# Patient Record
Sex: Male | Born: 1935 | Race: White | Hispanic: No | Marital: Married | Smoking: Never smoker
Health system: Southern US, Community
[De-identification: ages and names within clinical notes are randomized; demographics above are authoritative.]

## PROBLEM LIST (undated history)

## (undated) DIAGNOSIS — I82409 Acute embolism and thrombosis of unspecified deep veins of unspecified lower extremity: Secondary | ICD-10-CM

## (undated) DIAGNOSIS — K219 Gastro-esophageal reflux disease without esophagitis: Secondary | ICD-10-CM

## (undated) DIAGNOSIS — N3281 Overactive bladder: Secondary | ICD-10-CM

## (undated) DIAGNOSIS — K439 Ventral hernia without obstruction or gangrene: Secondary | ICD-10-CM

## (undated) DIAGNOSIS — G4730 Sleep apnea, unspecified: Secondary | ICD-10-CM

## (undated) DIAGNOSIS — E785 Hyperlipidemia, unspecified: Secondary | ICD-10-CM

## (undated) DIAGNOSIS — I10 Essential (primary) hypertension: Secondary | ICD-10-CM

## (undated) DIAGNOSIS — I2699 Other pulmonary embolism without acute cor pulmonale: Secondary | ICD-10-CM

## (undated) DIAGNOSIS — C189 Malignant neoplasm of colon, unspecified: Secondary | ICD-10-CM

## (undated) HISTORY — PX: COLON RESECTION: SHX5231

## (undated) HISTORY — DX: Gastro-esophageal reflux disease without esophagitis: K21.9

## (undated) HISTORY — PX: OTHER SURGICAL HISTORY: SHX169

## (undated) HISTORY — DX: Malignant neoplasm of colon, unspecified: C18.9

## (undated) HISTORY — DX: Ventral hernia without obstruction or gangrene: K43.9

## (undated) HISTORY — PX: TONSILLECTOMY: SUR1361

## (undated) HISTORY — DX: Overactive bladder: N32.81

## (undated) HISTORY — DX: Sleep apnea, unspecified: G47.30

## (undated) HISTORY — DX: Acute embolism and thrombosis of unspecified deep veins of unspecified lower extremity: I82.409

## (undated) HISTORY — DX: Other pulmonary embolism without acute cor pulmonale: I26.99

## (undated) HISTORY — DX: Essential (primary) hypertension: I10

## (undated) HISTORY — DX: Hyperlipidemia, unspecified: E78.5

---

## 1998-06-17 DIAGNOSIS — C189 Malignant neoplasm of colon, unspecified: Secondary | ICD-10-CM

## 1998-06-17 HISTORY — DX: Malignant neoplasm of colon, unspecified: C18.9

## 2002-07-28 ENCOUNTER — Encounter: Payer: Self-pay | Admitting: Internal Medicine

## 2004-06-29 ENCOUNTER — Ambulatory Visit: Payer: Self-pay | Admitting: Hematology & Oncology

## 2004-12-27 ENCOUNTER — Ambulatory Visit: Payer: Self-pay | Admitting: Internal Medicine

## 2004-12-27 ENCOUNTER — Ambulatory Visit: Payer: Self-pay | Admitting: Hematology & Oncology

## 2005-01-11 ENCOUNTER — Ambulatory Visit: Payer: Self-pay | Admitting: Internal Medicine

## 2005-11-25 ENCOUNTER — Encounter (INDEPENDENT_AMBULATORY_CARE_PROVIDER_SITE_OTHER): Payer: Self-pay | Admitting: *Deleted

## 2005-11-25 ENCOUNTER — Inpatient Hospital Stay (HOSPITAL_COMMUNITY): Admission: EM | Admit: 2005-11-25 | Discharge: 2005-11-29 | Payer: Self-pay | Admitting: Emergency Medicine

## 2005-12-20 ENCOUNTER — Ambulatory Visit: Payer: Self-pay | Admitting: Hematology & Oncology

## 2005-12-20 ENCOUNTER — Encounter: Admission: RE | Admit: 2005-12-20 | Discharge: 2005-12-20 | Payer: Self-pay | Admitting: General Surgery

## 2005-12-21 ENCOUNTER — Encounter (INDEPENDENT_AMBULATORY_CARE_PROVIDER_SITE_OTHER): Payer: Self-pay | Admitting: *Deleted

## 2005-12-27 LAB — CBC WITH DIFFERENTIAL/PLATELET
BASO%: 0.2 % (ref 0.0–2.0)
Basophils Absolute: 0 10*3/uL (ref 0.0–0.1)
Eosinophils Absolute: 0.2 10*3/uL (ref 0.0–0.5)
HCT: 44.2 % (ref 38.7–49.9)
HGB: 15.6 g/dL (ref 13.0–17.1)
LYMPH%: 22 % (ref 14.0–48.0)
MCHC: 35.3 g/dL (ref 32.0–35.9)
MONO#: 0.6 10*3/uL (ref 0.1–0.9)
NEUT%: 64.6 % (ref 40.0–75.0)
Platelets: 178 10*3/uL (ref 145–400)
WBC: 6.5 10*3/uL (ref 4.0–10.0)
lymph#: 1.4 10*3/uL (ref 0.9–3.3)

## 2005-12-27 LAB — COMPREHENSIVE METABOLIC PANEL
Albumin: 4.2 g/dL (ref 3.5–5.2)
BUN: 13 mg/dL (ref 6–23)
CO2: 24 mEq/L (ref 19–32)
Calcium: 8.9 mg/dL (ref 8.4–10.5)
Chloride: 109 mEq/L (ref 96–112)
Creatinine, Ser: 0.88 mg/dL (ref 0.40–1.50)
Glucose, Bld: 99 mg/dL (ref 70–99)
Potassium: 4.1 mEq/L (ref 3.5–5.3)

## 2006-01-21 ENCOUNTER — Encounter (INDEPENDENT_AMBULATORY_CARE_PROVIDER_SITE_OTHER): Payer: Self-pay | Admitting: *Deleted

## 2006-01-21 ENCOUNTER — Inpatient Hospital Stay (HOSPITAL_COMMUNITY): Admission: RE | Admit: 2006-01-21 | Discharge: 2006-02-01 | Payer: Self-pay | Admitting: General Surgery

## 2006-01-27 ENCOUNTER — Encounter: Payer: Self-pay | Admitting: Vascular Surgery

## 2006-01-27 ENCOUNTER — Ambulatory Visit: Payer: Self-pay | Admitting: Cardiology

## 2006-01-27 ENCOUNTER — Encounter: Payer: Self-pay | Admitting: Cardiology

## 2006-02-07 ENCOUNTER — Inpatient Hospital Stay (HOSPITAL_COMMUNITY): Admission: EM | Admit: 2006-02-07 | Discharge: 2006-03-03 | Payer: Self-pay | Admitting: Emergency Medicine

## 2006-02-13 ENCOUNTER — Encounter (INDEPENDENT_AMBULATORY_CARE_PROVIDER_SITE_OTHER): Payer: Self-pay | Admitting: *Deleted

## 2006-04-07 ENCOUNTER — Encounter (INDEPENDENT_AMBULATORY_CARE_PROVIDER_SITE_OTHER): Payer: Self-pay | Admitting: *Deleted

## 2006-04-30 ENCOUNTER — Encounter (INDEPENDENT_AMBULATORY_CARE_PROVIDER_SITE_OTHER): Payer: Self-pay | Admitting: *Deleted

## 2006-05-19 ENCOUNTER — Encounter: Admission: RE | Admit: 2006-05-19 | Discharge: 2006-05-19 | Payer: Self-pay | Admitting: Internal Medicine

## 2006-12-22 ENCOUNTER — Ambulatory Visit: Payer: Self-pay | Admitting: Hematology & Oncology

## 2006-12-24 LAB — CBC WITH DIFFERENTIAL/PLATELET
Basophils Absolute: 0 10*3/uL (ref 0.0–0.1)
EOS%: 2.1 % (ref 0.0–7.0)
HCT: 41.2 % (ref 38.7–49.9)
HGB: 14.7 g/dL (ref 13.0–17.1)
MCH: 31.8 pg (ref 28.0–33.4)
MONO#: 0.5 10*3/uL (ref 0.1–0.9)
NEUT#: 3.9 10*3/uL (ref 1.5–6.5)
NEUT%: 61.6 % (ref 40.0–75.0)
RDW: 12.8 % (ref 11.2–14.6)
WBC: 6.3 10*3/uL (ref 4.0–10.0)
lymph#: 1.7 10*3/uL (ref 0.9–3.3)

## 2006-12-24 LAB — COMPREHENSIVE METABOLIC PANEL
ALT: 11 U/L (ref 0–53)
AST: 13 U/L (ref 0–37)
Albumin: 4.3 g/dL (ref 3.5–5.2)
BUN: 14 mg/dL (ref 6–23)
CO2: 23 mEq/L (ref 19–32)
Calcium: 8.7 mg/dL (ref 8.4–10.5)
Chloride: 109 mEq/L (ref 96–112)
Creatinine, Ser: 0.85 mg/dL (ref 0.40–1.50)
Potassium: 4.1 mEq/L (ref 3.5–5.3)

## 2006-12-24 LAB — CEA: CEA: <0.5 ng/mL (ref 0.0–5.0)

## 2007-12-23 ENCOUNTER — Ambulatory Visit: Payer: Self-pay | Admitting: Hematology & Oncology

## 2007-12-23 ENCOUNTER — Encounter: Payer: Self-pay | Admitting: Internal Medicine

## 2007-12-23 LAB — COMPREHENSIVE METABOLIC PANEL
Albumin: 4.4 g/dL (ref 3.5–5.2)
Alkaline Phosphatase: 76 U/L (ref 39–117)
BUN: 16 mg/dL (ref 6–23)
CO2: 22 mEq/L (ref 19–32)
Calcium: 9.2 mg/dL (ref 8.4–10.5)
Glucose, Bld: 86 mg/dL (ref 70–99)
Potassium: 4.2 mEq/L (ref 3.5–5.3)
Sodium: 141 mEq/L (ref 135–145)
Total Protein: 7 g/dL (ref 6.0–8.3)

## 2008-08-07 ENCOUNTER — Ambulatory Visit: Payer: Self-pay | Admitting: Family Medicine

## 2008-08-07 DIAGNOSIS — R3129 Other microscopic hematuria: Secondary | ICD-10-CM | POA: Insufficient documentation

## 2008-08-07 DIAGNOSIS — R109 Unspecified abdominal pain: Secondary | ICD-10-CM | POA: Insufficient documentation

## 2008-08-07 LAB — CONVERTED CEMR LAB
Casts: 0 /lpf
WBC Urine, dipstick: NEGATIVE
pH: 5.5

## 2008-08-08 ENCOUNTER — Inpatient Hospital Stay (HOSPITAL_COMMUNITY): Admission: EM | Admit: 2008-08-08 | Discharge: 2008-08-15 | Payer: Self-pay | Admitting: Emergency Medicine

## 2008-08-08 ENCOUNTER — Encounter: Payer: Self-pay | Admitting: Family Medicine

## 2008-08-10 ENCOUNTER — Ambulatory Visit: Payer: Self-pay | Admitting: *Deleted

## 2008-08-10 ENCOUNTER — Encounter (INDEPENDENT_AMBULATORY_CARE_PROVIDER_SITE_OTHER): Payer: Self-pay | Admitting: Internal Medicine

## 2008-08-15 ENCOUNTER — Encounter (INDEPENDENT_AMBULATORY_CARE_PROVIDER_SITE_OTHER): Payer: Self-pay | Admitting: Internal Medicine

## 2008-12-20 ENCOUNTER — Ambulatory Visit: Payer: Self-pay | Admitting: Hematology & Oncology

## 2008-12-21 ENCOUNTER — Encounter: Payer: Self-pay | Admitting: Internal Medicine

## 2008-12-21 LAB — COMPREHENSIVE METABOLIC PANEL
Alkaline Phosphatase: 65 U/L (ref 39–117)
Creatinine, Ser: 1.16 mg/dL (ref 0.40–1.50)
Glucose, Bld: 89 mg/dL (ref 70–99)
Sodium: 141 mEq/L (ref 135–145)
Total Bilirubin: 0.6 mg/dL (ref 0.3–1.2)
Total Protein: 7.2 g/dL (ref 6.0–8.3)

## 2008-12-21 LAB — CEA: CEA: <0.5 ng/mL (ref 0.0–5.0)

## 2008-12-21 LAB — CBC WITH DIFFERENTIAL (CANCER CENTER ONLY)
BASO%: 0.3 % (ref 0.0–2.0)
LYMPH#: 1.3 10*3/uL (ref 0.9–3.3)
MONO#: 0.4 10*3/uL (ref 0.1–0.9)
Platelets: 157 10*3/uL (ref 145–400)
RDW: 12.3 % (ref 10.5–14.6)
WBC: 5.5 10*3/uL (ref 4.0–10.0)

## 2009-09-06 ENCOUNTER — Encounter (INDEPENDENT_AMBULATORY_CARE_PROVIDER_SITE_OTHER): Payer: Self-pay | Admitting: *Deleted

## 2009-09-11 ENCOUNTER — Ambulatory Visit: Payer: Self-pay | Admitting: Internal Medicine

## 2009-09-11 DIAGNOSIS — D689 Coagulation defect, unspecified: Secondary | ICD-10-CM | POA: Insufficient documentation

## 2009-09-11 DIAGNOSIS — K5900 Constipation, unspecified: Secondary | ICD-10-CM | POA: Insufficient documentation

## 2009-09-11 DIAGNOSIS — R1084 Generalized abdominal pain: Secondary | ICD-10-CM | POA: Insufficient documentation

## 2009-09-11 DIAGNOSIS — K625 Hemorrhage of anus and rectum: Secondary | ICD-10-CM | POA: Insufficient documentation

## 2009-09-11 DIAGNOSIS — Z85038 Personal history of other malignant neoplasm of large intestine: Secondary | ICD-10-CM | POA: Insufficient documentation

## 2009-10-17 ENCOUNTER — Encounter: Payer: Self-pay | Admitting: Internal Medicine

## 2009-10-19 ENCOUNTER — Ambulatory Visit: Payer: Self-pay | Admitting: Internal Medicine

## 2009-10-19 LAB — CONVERTED CEMR LAB
Basophils Absolute: 0 10*3/uL (ref 0.0–0.1)
Eosinophils Absolute: 0.1 10*3/uL (ref 0.0–0.7)
Lymphocytes Relative: 14.2 % (ref 12.0–46.0)
MCHC: 34.7 g/dL (ref 30.0–36.0)
Neutrophils Relative %: 78 % — ABNORMAL HIGH (ref 43.0–77.0)
Platelets: 140 10*3/uL — ABNORMAL LOW (ref 150.0–400.0)
RDW: 13.2 % (ref 11.5–14.6)

## 2009-10-21 ENCOUNTER — Encounter: Payer: Self-pay | Admitting: Internal Medicine

## 2009-10-30 ENCOUNTER — Ambulatory Visit: Payer: Self-pay | Admitting: Internal Medicine

## 2009-10-30 DIAGNOSIS — K5289 Other specified noninfective gastroenteritis and colitis: Secondary | ICD-10-CM | POA: Insufficient documentation

## 2009-12-19 ENCOUNTER — Ambulatory Visit: Payer: Self-pay | Admitting: Hematology & Oncology

## 2009-12-20 ENCOUNTER — Encounter: Payer: Self-pay | Admitting: Internal Medicine

## 2009-12-20 LAB — COMPREHENSIVE METABOLIC PANEL
ALT: 10 U/L (ref 0–53)
Albumin: 4.3 g/dL (ref 3.5–5.2)
Alkaline Phosphatase: 71 U/L (ref 39–117)
CO2: 22 mEq/L (ref 19–32)
Glucose, Bld: 86 mg/dL (ref 70–99)
Potassium: 4.4 mEq/L (ref 3.5–5.3)
Sodium: 137 mEq/L (ref 135–145)
Total Protein: 6.8 g/dL (ref 6.0–8.3)

## 2009-12-20 LAB — CBC WITH DIFFERENTIAL (CANCER CENTER ONLY)
BASO%: 0.6 % (ref 0.0–2.0)
Eosinophils Absolute: 0.1 10*3/uL (ref 0.0–0.5)
MONO#: 0.4 10*3/uL (ref 0.1–0.9)
MONO%: 9 % (ref 0.0–13.0)
NEUT#: 2.8 10*3/uL (ref 1.5–6.5)
Platelets: 149 10*3/uL (ref 145–400)
RBC: 4.48 10*6/uL (ref 4.20–5.70)
WBC: 4.6 10*3/uL (ref 4.0–10.0)

## 2009-12-20 LAB — VITAMIN D 25 HYDROXY (VIT D DEFICIENCY, FRACTURES): Vit D, 25-Hydroxy: 38 ng/mL (ref 30–89)

## 2010-07-17 NOTE — Assessment & Plan Note (Signed)
Summary: Followup from colonoscopy-   History of Present Illness Visit Type: Follow-up Visit Primary GI MD: Yancey Flemings MD Primary Provider: Creola Corn, MD  Requesting Provider: n/a Chief Complaint: F/u colonoscopy, pt still having lower abd discomfort with intermittant rectal bleeding with hard BM's. Pt did start the glycolax with some improvement in constipation. History of Present Illness:   77 year old male with hypertension, hyperlipidemia, deep vein thrombosis with pulmonary embolus, recurrent deep vein thrombosis off Coumadin but now on chronic Coumadin therapy, and remote history of colon cancer x2 for which he underwent sigmoid colectomy and right hemicolectomy respectively. He was seen in the office March 28 with complaints of bloating, rectal bleeding, and abdominal discomfort. He seemed to be having some difficulties with constipation. He was due for routine followup colonoscopy which was performed on Oct 19, 2009. The small rectal pouch revealed nonspecific inflammation. The residual colon was normal without polyps or cancers. The ileocolonic anastomosis as well as the terminal ileum revealed multiple punctate ulcers. Biopsies were obtained and revealed active ileitis. No evidence of malignancy. The usual differential diagnosis suggested. No granulomas seen. Blood work revealed hemoglobin of 13.4, blood cell count 6.3, and total platelet count of 140,000. C-Reactive protein was minimally elevated at 6.59. He presents today for followup. He is accompanied by his wife. He has not been using MiraLax as recommended. Continues to describe some difficulty with evacuation and some discomfort related to position. Some vague bloating. We discussed in detail the findings at colonoscopy as well as the pathology. He tells me that he had a CT scan of the abdomen last year. I was able to obtain and review her report from February 22. He was found to have gallstones, small left renal calculus, and postoperative  changes. Prior KUB February 13, 2009 revealed constipation.   GI Review of Systems    Reports abdominal pain and  bloating.     Location of  Abdominal pain: lower abdomen.    Denies acid reflux, belching, chest pain, dysphagia with liquids, dysphagia with solids, heartburn, loss of appetite, nausea, vomiting, vomiting blood, weight loss, and  weight gain.      Reports constipation and  rectal bleeding.     Denies anal fissure, black tarry stools, change in bowel habit, diarrhea, diverticulosis, fecal incontinence, heme positive stool, hemorrhoids, irritable bowel syndrome, jaundice, light color stool, liver problems, and  rectal pain.    Current Medications (verified): 1)  Lipitor 10 Mg Tabs (Atorvastatin Calcium) .Marland Kitchen.. 1 Tablet By Mouth Once Daily 2)  Multivitamins  Tabs (Multiple Vitamin) .Marland Kitchen.. 1 Tablet By Mouth Once Daily 3)  Aspirin 81 Mg Tbec (Aspirin) .Marland Kitchen.. 1 Tablet By Mouth Once Daily 4)  Coumadin 5 Mg Tabs (Warfarin Sodium) .... One Tablet By Mouth Once Daily 5)  Fish Oil 1000 Mg Caps (Omega-3 Fatty Acids) .... One Tablet By Mouth Once Daily 6)  Stool Softener 100 Mg Caps (Docusate Sodium) .... One Tablet By Mouth Once Daily 7)  Cpap .... As Directed 8)  Lisinopril 10 Mg Tabs (Lisinopril) .... One Tablet By Mouth Once Daily 9)  Glycolax  Powd (Polyethylene Glycol 3350) .Marland KitchenMarland KitchenMarland Kitchen 17 Grams in 8 Oz of Water As Needed  Allergies (verified): 1)  ! Demerol 2)  ! Codeine 3)  ! Phenergan  Past History:  Past Medical History: Reviewed history from 09/11/2009 and no changes required. colon cancer blood clot in lung colostomy DVT Sleep Apnea Small Bowel Obstruction Hypertension Hyperlipidemia  Past Surgical History: Reviewed history from 09/11/2009 and no changes  required. colon cancer 1999 lesions from prior surgery removed  twice 2008 Rt. sigmoid resection with colostomy rt. hemicolectomy Appendectomy  Family History: Reviewed history from 09/11/2009 and no changes  required. mother deceased - diabetic father deceased age 28 heart attack No FH of Colon Cancer:  Social History: Reviewed history from 09/11/2009 and no changes required. denies smoking, drinking or recreational drug use Retired Married 2 childern  Review of Systems  The patient denies allergy/sinus, anemia, anxiety-new, arthritis/joint pain, back pain, blood in urine, breast changes/lumps, change in vision, confusion, cough, coughing up blood, depression-new, fainting, fatigue, fever, headaches-new, hearing problems, heart murmur, heart rhythm changes, itching, menstrual pain, muscle pains/cramps, night sweats, nosebleeds, pregnancy symptoms, shortness of breath, skin rash, sleeping problems, sore throat, swelling of feet/legs, swollen lymph glands, thirst - excessive , urination - excessive , urination changes/pain, urine leakage, vision changes, and voice change.    Vital Signs:  Patient profile:   77 year old male Height:      62 inches Weight:      197.38 pounds BMI:     36.23 Pulse rate:   68 / minute Pulse rhythm:   regular BP sitting:   134 / 70  (left arm) Cuff size:   regular  Vitals Entered By: Christie Nottingham CMA Duncan Dull) (Oct 30, 2009 1:50 PM)  Physical Exam  General:  Well developed, well nourished, no acute distress. Head:  Normocephalic and atraumatic. Eyes:  PERRLA, no icterus. Mouth:  No deformity or lesions, dentition normal. Lungs:  Clear throughout to auscultation. Heart:  Regular rate and rhythm; no murmurs, rubs,  or bruits. Abdomen:  obese but soft without tenderness. Ostomy in the left lower quadrant. Multiple surgical incisions well healed. No obvious hernia. Prominent vascular pattern on the abdominal wall below the umbilicus. Pulses:  Normal pulses noted. Extremities:  no edema  Neurologic:  alert and oriented Skin:  no jaundice Psych:  Alert and cooperative. Normal mood and affect.   Impression & Recommendations:  Problem # 1:  ILEITIS  (ICD-558.9) nonspecific mild ileitis. Possibly mild Crohn's disease. I discussed the implications of this diagnosis with the patient and his wife. It's uncertain to me that his symptom complex can be explained by the findings at colonoscopy. No overwhelming changes to the small bowel on CT last year. We discussed a number of options including additional workup with CT enterography. Capsule inappropriate given multiple surgeries. We also discussed possible empiric therapies such as Entocort (though this may be prohibitively expensive) or low-dose prednisone. At this point he is less inclined to pursue additional investigations or these empiric therapies without more obvious symptoms. As such, who will be observed with time.Marland Kitchen He will try to improve constipation GlycoLax. He will call the office if new or worsening symptoms develop.  Problem # 2:  ABDOMINAL PAIN -GENERALIZED (ICD-789.07) generalized abdominal pain most likely due to adhesions and fixed portion of the colon . The problem seems to be exacerbated by relative constipation.  Plan: #1. GlycoLax. Titrated to need  Problem # 3:  PERSONAL HISTORY MALIG NEOPLASM LARGE INTESTINE (ICD-V10.05) Assessment: Comment Only  Problem # 4:  CONSTIPATION (ICD-564.00) Assessment: Unchanged  Problem # 5:  RECTAL BLEEDING (ICD-569.3) due to mild diversion pouchitis. No specific therapy available  Patient Instructions: 1)  Nothing to do for patient today. 2)  The medication list was reviewed and reconciled.  All changed / newly prescribed medications were explained.  A complete medication list was provided to the patient / caregiver. 3)  copy:  Dr. Creola Corn

## 2010-07-17 NOTE — Op Note (Signed)
Summary: operative report   NAME:  [name], [name]             ACCOUNT NO.:  [id number]   MEDICAL RECORD NO.:  [id number]          PATIENT TYPE:  INP   LOCATION:  2899                         FACILITY:  [facility]   PHYSICIAN:  Sharlet Salina T. Hoxworth, M.D.DATE OF BIRTH:  03/27/1934   DATE OF PROCEDURE:  01/21/2006  DATE OF DISCHARGE:                                 OPERATIVE REPORT   PREOPERATIVE DIAGNOSES:  1. Hartmann colostomy.  2. Ventral hernia.  3. History of small bowel obstruction.   POSTOPERATIVE DIAGNOSIS:  1. Hartmann colostomy.  2. Ventral hernia.  3. History of small bowel obstruction.  4. Parastomal hernia.   SURGICAL PROCEDURES:  1. Attempted takedown of Hartmann colostomy.  2. Repair of ventral hernia with biologic mesh.  3. Ring repair of parastomal hernia with biologic mesh.  4. Lysis of adhesions.   SURGEON:  [name], [name]   ASSISTANT:  Ovidio Kin, [name]   ANESTHESIA:  General.   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  He has had a colostomy  since that time.  I initially saw him about 2 years ago for consideration  for takedown of his colostomy at which point he declined.  He however has  had a couple of episodes of small bowel obstruction resolving spontaneously  and has a good-sized upper abdominal ventral incisional hernia that has  developed.  With these findings he represented and due to the new problems  as well which could be corrected the same time, we have elected to proceed  with takedown of his Gertie Gowda colostomy, repair of his ventral hernia and  lysis of adhesions.  He has had a preoperative barium enema showing a fairly  short, but what appears to be adequate rectosigmoid pouch about 7 cm in  length.  Preoperatively the nature of the procedure, its indications, risks  of bleeding, infection were discussed and understood.  We discussed  potential difficulties reversing his colostomy.  He is now brought to  operating room for this procedure.   DESCRIPTION OF OPERATION:  Following mechanical and antibiotic bowel prep at  home the patient brought to operating room and placed supine position  operating table and general orotracheal anesthesia was induced.  He received  broad spectrum preoperative IV antibiotics.  A pursestring silk suture was  used to occlude his stoma and the abdomen and perineum were widely sterilely  prepped and draped.  He had a long midline incision.  This entire incision  was opened as his large ventral hernia was in the upper abdomen.  Dissection  was carried down through subcutaneous tissue, the peritoneum entered.  The  abdomen was relatively free of adhesions, considering his history.  Some  omental adhesions into the hernia sac and some adhesions of omentum and  small bowel to the anterior abdominal wall were lysed.  There were a number  of inner loop adhesions of the small bowel and also some adhesions  of small  bowel down to the pelvis which were completely lysed under direct vision.  The pelvis was then the widely exposed.  There were two Prolene sutures at  the peritoneal reflection which appeared to likely mark the end of the  rectal pouch.  This was right at the peritoneal reflection with just a  smooth peritoneum up to the bladder.  Initially Dr. Ezzard Standing scoped the  patient from below with a rigid sigmoidoscope.  This could not be advanced  past what appeared to be a stricture at about 7 or 8 cm and I could not feel  the tip of the scope at the pouch.  We therefore dissected out the rectal  stump developing planes between the sump and the sacrum and between the  stump and the bladder and working laterally down along the pelvic sidewall  and it was mobilized for about 5 cm.  At this point we again tried the scope  and also a  curve EEA stapler to see if we could pass this up to the end of  the stump, but it would not advance to within several centimeters of the  end.  At this point then we opened the rectal stump from within the  abdominal cavity and it was found to be markedly thickened, stenotic and  actually quite friable.  The stenosis extended for at least several  centimeters down to probably about the level of the mid prostate.  I tried  to gently dilate this with a Tresa Endo but the tissue was fibrotic and friable  and only split rather than dilating.  I could get my finger through this  just barely and with Dr. Ezzard Standing scoping from below again, the rectum did not  appear to open up into a more normal rectum for another approximately 3 cm  beyond where we had dissected which would be down probably around 7 cm down  to the probably lower border of the prostate or below.  Examine the rectum  again from above, we felt that this dissection would carry [place] so low in this  very narrow male pelvis and with uncertain condition of the radiated rectum  below and obviously radiation damage to rectum to this point that we could  not safely accomplish an anastomosis.  I did not feel there was any way to  get a stapling device down this low in this very narrow pelvis with no real  handle of proximal colon to work with and this appeared well below where it  could be safely hand sutured.  With these concerns and with the concern  about the condition of the rectum post radiation, we felt that it was  overall safest and best to abandon attempts at the colostomy takedown and to  close the rectal stump.  Therefore the stump was closed with running 2-0  Vicryl.  The pelvis was irrigated.  Hemostasis assured.  Noted at this time  was a moderate size parastomal hernia which I really did not appreciate  preoperatively.  I felt it would be best to repair this with the patient  history of episodic small-bowel obstruction.  This was  accomplished by completely dissecting adhesions away from the edge of the abdominal wall  defect defining it which was enlarged moderately.  The defect was carried  down with one 0 Prolene suture snugging the defect down to a fingertip next  to the bowel.  An 8 x 12 cm ultra thick, MDF acellular dermis of patch was  then used, split, wrapped around the stoma and sutured to the peritoneum and  fascia circumferentially with interrupted 0 Prolenes as a strengthening  patch around the stoma.  Following this, attention was turned to the fairly  large upper abdominal ventral incisional hernia.  The hernia sac was  completely excised.  Skin and subcutaneous flaps raised back several  centimeters in all directions from the upper end of the incision down to  about the level of the umbilicus where there was no further herniation.  Following this the midline fascia was closed with running #1 PDS beginning  at the either end of the incision and tied centrally with interrupted 0  Prolene sutures intermittently.  This was done with mild tension on the  upper fascia where the hernia had been.  Following this a 8 x 12 cm piece of  MDF acellular dermis was used as an onlay patch and sutured  circumferentially with interrupted 0-0 Prolene with nice wide coverage of  the involved fascia.  The subcutaneous tissue was irrigated.  Closed suction  drain was left subcutaneous space and skin closed with staples.  Sponge,  needle and instrument counts were correct.  Dry sterile dressings was  applied.  The pursestring suture was removed from the stoma site.  Ostomy  bag was placed.  The patient taken recovery in satisfactory condition.     [name], [name]  Electronically Signed    BTH/MEDQ  D:  01/21/2006  T:  01/21/2006  Job:  [id number]

## 2010-07-17 NOTE — Letter (Signed)
Summary: [facility] Instructions  [facility]  [address] [place], [place] [address]   Phone: [phone]  Fax: [phone]       [name]    [birth year]    MRN: [id number]        Procedure Day /Date:THURSDAY, 10/19/09     Arrival Time:7:30 AM     Procedure Time:8:00 AM     Location of Procedure:                    X  [facility] (4th Floor)   PREPARATION FOR COLONOSCOPY WITH MOVIPREP   Starting 5 days prior to your procedure 5/1/11do not eat nuts, seeds, popcorn, corn, beans, peas,  salads, or any raw vegetables.  Do not take any fiber supplements (e.g. Metamucil, Citrucel, and Benefiber).  THE DAY BEFORE YOUR PROCEDURE         DATE: 10/18/09  DAY: WEDNESDAY  1.  Drink clear liquids the entire day-NO SOLID FOOD  2.  Do not drink anything colored red or purple.  Avoid juices with pulp.  No orange juice.  3.  Drink at least 64 oz. (8 glasses) of fluid/clear liquids during the day to prevent dehydration and help the prep work efficiently.  CLEAR LIQUIDS INCLUDE: Water Jello Ice Popsicles Tea (sugar ok, no milk/cream) Powdered fruit flavored drinks Coffee (sugar ok, no milk/cream) Gatorade Juice: apple, white grape, white cranberry  Lemonade Clear bullion, consomm, broth Carbonated beverages (any kind) Strained chicken noodle soup Hard Candy                             4.  In the morning, mix first dose of MoviPrep solution:    Empty 1 Pouch A and 1 Pouch B into the disposable container    Add lukewarm drinking water to the top line of the container. Mix to dissolve    Refrigerate (mixed solution should be used within 24 hrs)  5.  Begin drinking the prep at 5:00 p.m. The MoviPrep container is divided by 4 marks.   Every 15 minutes drink the solution down to the next mark (approximately 8 oz) until the full liter is complete.   6.  Follow completed prep with 16 oz of clear liquid of your choice (Nothing red or purple).  Continue  to drink clear liquids until bedtime.  7.  Before going to bed, mix second dose of MoviPrep solution:    Empty 1 Pouch A and 1 Pouch B into the disposable container    Add lukewarm drinking water to the top line of the container. Mix to dissolve    Refrigerate  THE DAY OF YOUR PROCEDURE      DATE: 10/19/09 DAY: THURSDAY  Beginning at 3:00 a.m. (5 hours before procedure):         1. Every 15 minutes, drink the solution down to the next mark (approx 8 oz) until the full liter is complete.  2. Follow completed prep with 16 oz. of clear liquid of your choice.    3. You may drink clear liquids until 6:00 AM (2 HOURS BEFORE PROCEDURE).   MEDICATION INSTRUCTIONS  Unless otherwise instructed, you should take regular prescription medications with a small sip of water   as early as possible the morning of your procedure.   Additional medication instructions: _ STAY ON COUMADIN PER DR. Rosaura Bolon.  HAVE PT/INR CHECKED 2 DAYS PRIOR TO PROCEDURE.  OTHER INSTRUCTIONS  You will need a responsible adult at least 75 years of age to accompany you and drive you home.   This person must remain in the waiting room during your procedure.  Wear loose fitting clothing that is easily removed.  Leave jewelry and other valuables at home.  However, you may wish to bring a book to read or  an iPod/MP3 player to listen to music as you wait for your procedure to start.  Remove all body piercing jewelry and leave at home.  Total time from sign-in until discharge is approximately 2-3 hours.  You should go home directly after your procedure and rest.  You can resume normal activities the  day after your procedure.  The day of your procedure you should not:   Drive   Make legal decisions   Operate machinery   Drink alcohol   Return to work  You will receive specific instructions about eating, activities and medications before you leave.    The above instructions have been reviewed and  explained to me by   _______________________    I fully understand and can verbalize these instructions _____________________________ Date _________

## 2010-07-17 NOTE — Op Note (Signed)
Summary: operative report   NAME:  [name], [name]             ACCOUNT NO.:  [id number]   MEDICAL RECORD NO.:  [id number]          PATIENT TYPE:  INP   LOCATION:  2102                         FACILITY:  [facility]   PHYSICIAN:  Sharlet Salina T. Hoxworth, M.D.DATE OF BIRTH:  Aug 10, 1933   DATE OF PROCEDURE:  02/13/2006  DATE OF DISCHARGE:                                 OPERATIVE REPORT   PREOPERATIVE DIAGNOSIS:  Small bowel obstruction and pelvic abscess.   POSTOPERATIVE DIAGNOSIS:  Small bowel obstruction and pelvic abscess.   SURGICAL PROCEDURE:  1. Laparotomy with lysis of adhesions.  2. Drainage of pelvic abscess.  3. Small-bowel resection with anastomosis and placement of central line.   SURGEON:  Dr. Johna Sheriff   ASSISTANT:  Dr. Lorelee New   ANESTHESIA:  General.   BRIEF HISTORY:  Desten Manor is a 77 year old male, who is approximately  2-1/2 weeks following laparotomy and attempted takedown of a Hartmann  colostomy.  However, at that time, he was found to have significant  radiation damage to his rectosigmoid, and anastomosis was not possible.  He  did undergo a repair of a ventral incisional hernia using acellular dermis  and also repair of a parastomal hernia using acellular dermis.  Postoperatively, the patient's course was complicated by pulmonary embolus  requiring anticoagulation.  He then, just after discharge, was readmitted  with a small bowel obstruction that has been persistent despite conservative  management.  CT scan has shown a transition point in the pelvis with a small  amount of fluid and air adjacent to the rectal stump consistent with a small  abscess.  Due to failure to progress with conservative management with bowel  rest and NG suction over several days and persistent small bowel  obstruction, I have recommended proceeding with laparotomy.  The nature of  the procedure, indications, risks of bleeding and infection were discussed  and understood.  The  patient has undergone preoperative placement of a vena  cava filter due to recent PE and needing to be off anticoagulation for  surgery.  I have also recommended proceeding with central line placement for  postoperative TNA.   DESCRIPTION OF OPERATION:  The patient brought to the operating room, placed  in supine position on the operating table, and general orotracheal  anesthesia was induced.  The abdomen was widely sterilely prepped and draped  with Ioban and the stoma excluded.  Foley catheter was placed.  NG tube was  already placed.  He received broad spectrum preoperative IV antibiotics.  Correct patient and procedure were verified.  The previous midline incision  was used along about its lower two-thirds and dissection carried down  through subcutaneous tissue down to the fascia which was divided with  cautery.  On the upper portion of the incision, I did go ahead and divide  down through the acellular dermis, probably the lower half of it that had  been placed on his upper abdominal incisional hernia.  It seemed well  incorporated at this point to the fascia and was left in place.  Dissection  was carried down to the peritoneum and  with careful dissection, the  peritoneum was entered in 1 area, and then very dense adhesions of small  bowel were taken down from the underside of the incision with careful sharp  dissection.  There were fairly marked adhesions that would be expected at  this time postoperatively.  However, as the dissection progressed away from  the wound, these became somewhat more filmy, and we were able to clear the  entire anterior abdominal wall in all directions.  There were some dense  adhesions of the small bowel up around the area of the colostomy but no  recurrent hernia, and these were all taken down.  The ligament of Treitz was  identified, and the proximal bowel was quite dilated.  The bowel was then  traced distally, dividing multiple interloop adhesions.   The bowel remained  dilated down to the ileum which was found to be densely adherent down to the  area of the sacrum and rectal stump.  There was a very hard area of  inflammation and a very densely adherent loop of bowel to this area.  This  was carefully sharply dissected up and a small abscess cavity entered with  pus.  This was not foul smelling.  It was cultured.  This cavity was just  adjacent to and posterior to the rectal stump that had been opened at the  time of attempted takedown of his [facility] colostomy.  The stump closure  appeared intact, and I did not see any communication into the rectal stump.  This area was thoroughly irrigated.  The small bowel was then able to be  brought completely up out of the pelvis.  At this point, the area of bowel  that had been stuck down to the abscess and was essentially a portion of the  wall of the abscess was carefully inspected, and there was a small pinhole  in this bowel but also more significantly marked scarring and narrowing of  the bowel secondary to this inflammatory process over about a 5-6 inch area,  and we did not feel that this could simply be repaired and be reliably  patent.  We elected to proceed with resection as the bowel just proximal and  distal to this appeared relatively healthy.  The mesentery of the short  involved segment was then sequentially divided between clamps and tied with  2-0 silk ties.  Following this, a functional end-to-end anastomosis was  created with the GIA stapler, and the common enterotomy was closed and the  specimen removed with a single firing of the TA-60 stapler.  The anastomosis  was tested under pressure without leak.  The crotch of the staple line was  reinforced with the 2-0 silk suture.  The mesentery was closed with  interrupted 2-0 silks.  The abdomen was then thoroughly irrigated and  complete hemostasis assured.  We were concerned that the small bowel would again become adherent to  the pelvis with the raw surface and drained abscess  cavity, and a piece of SurgiWrap barrier was trimmed to cover the floor of  the pelvis and was tacked in place with interrupted 3-0 Vicryl and then the  small bowel replaced into the pelvis over this to try to prevent adhesions.  The small bowel was again carefully run from the patient's previous  ileocolonic anastomosis on the right back up to the ligament of Treitz, and  all was completely free and uninjured.  Following this, another piece of  SurgiWrap was placed underneath the incision  in an attempt to prevent  adhesions, and the wound was closed with running #1 PDS begun at either end  of the incision and tied centrally, again incorporating the previously  placed acellular dermis and upper part of the incision which appeared nicely  incorporated into the fascia at this point.  The subcutaneous tissue was  irrigated, and the skin was closed with staples.  Following this, the  patient's left neck and shoulder were sterilely prepped and draped and the  left subclavian vein easily cannulated with needle and guidewire and s  double lumen central catheter placed over the guidewire.  This flushed and  aspirated easily and was flushed with saline and sutured in place.  Sponge,  needle, and instrument counts were correct.  The patient then taken to  recovery in satisfactory condition.      [name], [name]  Electronically Signed     BTH/MEDQ  D:  02/13/2006  T:  02/14/2006  Job:  [id number]

## 2010-07-17 NOTE — Procedures (Signed)
Summary: Colonoscopy  Patient: [name] Note: All result statuses are Final unless otherwise noted.  Tests: (1) Colonoscopy (COL)   COL Colonoscopy           DONE     [facility]     [address]     [place], [place]  [address]           COLONOSCOPY PROCEDURE REPORT           PATIENT:  [name], [name]  MR#:  [id number]     BIRTHDATE:  [birth year], 76 yrs. old  GENDER:  male     ENDOSCOPIST:  Wilhemina Bonito. Eda Keys, MD     REF. BY:  Office     PROCEDURE DATE:  10/19/2009     PROCEDURE:  Colonoscopy with biopsy     ASA CLASS:  Class II     INDICATIONS:  history of colon cancer (resection x 2); abd c/o and     bleeding     MEDICATIONS:   Fentanyl 75 mcg IV, Versed 6 mg IV           DESCRIPTION OF PROCEDURE:   After the risks benefits and     alternatives of the procedure were thoroughly explained, informed     consent was obtained.  Digital rectal exam was performed and     revealed anal stenosis w/ pain.   The LB CF-H180AL [id number]     endoscope was introduced through the stoma and advanced to the     anastamosis (time = 3:24 min),and then ileum, without limitations.     The quality of the prep was excellent, using MoviPrep.  The     instrument was then slowly withdrawn (time = 10:32 min) as the     colon was fully examined. Subsequent exam of the rectal pouch.     <<PROCEDUREIMAGES>>           FINDINGS:  There were inflammatory changes in the ileum as     manifested by multiple punctate ulcers. Surgical suture seen.     Multiple bx taken..  The right colon was surgically resected and     an ileo-colonic anastamosis was seen with some ulceration..  There     was evidence of a prior segmental colectomy.  No polyps or cancers     were seen.  the seperate rectal inspection revealed only a 2cm     friable stump w/ pinpoint stenosis.  Retroflexed views in the     rectum revealed Unable to retroflex.    The scope was then     withdrawn from the patient and the procedure  completed.           COMPLICATIONS:  None     ENDOSCOPIC IMPRESSION:     1) Ileitis - s/p biopsies     2) Prior right hemi-colectomy     3) Prior sigmoid segmental colectomy     4) No polyps or cancers     5) small rectal pouch a described           RECOMMENDATIONS:     1) Follow up colonoscopy in 5 years     2) Await pathology results     3) CBC and C reactive protein     4) Office follow upnext available           ______________________________     Wilhemina Bonito. Eda Keys, MD           CC:  Creola Corn, MD; The Patient           n.     eSIGNED:   Wilhemina Bonito. Eda Keys at 10/19/2009 08:55 AM           Pricilla Loveless, [id number]  Note: An exclamation mark (!) indicates a result that was not dispersed into the flowsheet. Document Creation Date: 10/19/2009 8:56 AM _______________________________________________________________________  (1) Order result status: Final Collection or observation date-time: 10/19/2009 08:41 Requested date-time:  Receipt date-time:  Reported date-time:  Referring Physician:   Ordering Physician: Fransico Setters [phone]) Specimen Source:  Source: Launa Grill Order Number: [id number] Lab site:   Appended Document: Colonoscopy recall in 5 yrs     Procedures Next Due Date:    Colonoscopy: 10/2014

## 2010-07-17 NOTE — Discharge Summary (Signed)
Summary: discharge summary   NAME:  [name] [name]             ACCOUNT NO.:  [id number]      MEDICAL RECORD NO.:  [id number]          PATIENT TYPE:  INP      LOCATION:  3031                         FACILITY:  [facility]      PHYSICIAN:  John M Russo, MD       DATE OF BIRTH:  03/09/1934      DATE OF ADMISSION:  08/08/2008   DATE OF DISCHARGE:                                  DISCHARGE SUMMARY      PRIMARY CARE PROVIDER:  Myself.      DISCHARGE DIAGNOSES:   1. Extensive left lower extremity deep venous thrombosis including       left common femoral vein, superficial femoral vein, popliteal vein,       origins of the great saphenous and proximal profunda femoral veins.   2. Hypercholesterolemia.   3. History of colon cancer status post ostomy.   4. Status post failed takedown that was complicated by a deep venous       thrombosis and pulmonary emboli and an inferior vena cava filter       placement.   5. Status post Coumadin treatment for the deep venous thrombosis and       pulmonary embolus for 1 year.  The patient elected to come off of       this about 1 year ago.   6. Groin pain and lower extremity edema secondary to the new acute       deep venous thrombosis.   7. Obstructive sleep apnea on continuous positive airway pressure of       8.   8. Insomnia.  Uses Tylenol PM at home on occasion.   9. Hyperglycemia without diabetes.   10.Hematuria for urgent care, but he had either a kidney stone versus       a urinary tract infection for which he was placed on antibiotics.       Repeat urinalysis is negative, and computed tomography scan showed       small left renal calculus.   11.History of small bowel obstruction secondary to pelvic adhesions       and a small pelvic abscess status post laparotomy, lysis of       adhesions, and drainage of the abscess on February 13, 2006.   12.Colostomy.   13.Radiation treatment and chemotherapy in 2000 for the colon cancer.      DISCHARGE  MEDICATION LIST:   1. Coumadin 5 mg p.o. daily.   2. Simvastatin 10 mg p.o. daily.   3. Fish oil 1 tablet p.o. daily.   4. Aspirin 81 mg p.o. daily.   5. Stool softener daily.   6. Gas-X 1-2 per day.   7. CPAP as directed at 8 cmH20.      AFTERCARE FOLLOW-UP INSTRUCTIONS:  He is to follow up with me as   directed in my Coumadin clinic.  He will be on Coumadin for lifelong   discharge procedures to include:      1. An outpatient left lower extremity venous duplex Doppler         ultrasound.  I tried imaging on August 08, 2008 with findings       indicative of extensive deep venous thrombosis of the left lower       extremity.   2. CT scan on August 08, 2008 of the abdomen and pelvis with and       without contrast showing small left renal calculus without       obstructive changes.  Cholelithiasis without acute pericholecystic       inflammatory type changes.  Surgical changes of the lower pelvis       and left-sided colostomy.  Marked increased densities of the lower       pelvic mesenteric fat suggest inflammatory changes of intermediate       chronicity.  Consider postsurgical changes, radiation changes,       and/or acute inflammatory process.  Contracted urinary bladder with       diffusely thickened bladder wall.  Easy increased density of the       left inguinal crease suggest inflammatory change.  Asymmetric       increased left common iliac vein size with diminished postcontrast       enhancement.  Consider DVT given the history of the acute left leg       swelling and pain.  Vena cava filter noted.      HISTORY OF PRESENT ILLNESS:  Briefly, [name] [name] is a 77-year-old male   with past medical history of colon cancer status post surgery,   chemotherapy, and radiation with colostomy placement.  He tried to   undergo a colostomy takedown which was unsuccessful, and unfortunately   was complicated with the DVT and pulmonary emboli.  He took Coumadin for   over a year, and  despite knowing the risks and benefits decided that he   wanted to come off.  He came off about a year ago.  He presented to   medical attention on the day prior to admission on August 07, 2008 at   [facility] for lower abdominal pain, difficulty urinating,   and other lower abdominal and left lower quadrant issues.  At urgent   care it was not quite clear what was going on.  He had a urinalysis   which showed hematuria.  He was treated with Darvocet and Cipro.  He was   told to increase his fluid intake, and follow up with me in 24-48 hours   and go to the ER if symptoms worsen.  The next day he showed up in the   office.  On review of his history and exam, he had increasing swelling   over the night before and with his history a DVT was strongly   considered.  I could not figure out why he was having so much abdominal   pain except that if the DVT extended up into the iliacs and maybe   femoral veins and with a history of the hematuria, I had lots of   potential concerns.  Therefore I went ahead and did the CT scan.  The CT   scan report as above.  This was followed up with the lower extremity   Doppler, and due to the massive and extensive DVT he was put in the   hospital for heparin and Coumadin overlap.      HOSPITAL COURSE:  [name] [name] was admitted through my office on   August 08, 2008 for an extensive left lower extremity DVT that     extended up into his left lower quadrant and iliac veins.  Over the next   several days on the heparin and Coumadin, his left lower extremity   swelling diminished.  His left inguinal and abdominal pains diminished.   His ability to get up and move around improved.  At no time did he have   any cellulitis.  At no time did he have any loss of dorsalis pedal   pulses.  Over the first few days I had debated whether he would be a   good candidate for a direct catheter lytic-type procedure to reduce the   clot burden.  Fortunately  for the patient he did not have any phlegmasia   or any true indication due to this.  He did have the filter in place   which was reassuring.  He never had any chest pain, shortness of breath   or hypoxia throughout his whole hospital stay.  Due to the massive   nature of this clot, I elected to keep him on heparin for 7 full days   and Coumadin.  His INR has been therapeutic for the last couple days,   and I did follow up with 1 lower extremity Doppler while in the hospital   and this did continue to show massive amounts of clot.  I think due to   the nature of his clot I think that continuing to follow this up on a   periodic basis until a decent amount of resolution has passed is   worthwhile.  He will now need to be on Coumadin lifelong, and I expect   some left lower extremity venous damage with chronic lower extremity   edema, maybe some venous stasis after this whole process is complete.      As far as his vitals, have remained stable throughout the hospital stay.   His physical exam and his mood remained appropriate.  He did not have   any major issues.  A chest x-ray was done on February 23 revealing no   active lung disease.  Labs were unremarkable throughout his stay.  His   INR was 2.9 on March 1.  White count was 7.8, hemoglobin 12.2, platelet   count was 222.  A urine culture was done and was negative.  Urinalysis   was done on February 23 which showed no blood, no protein, a small   amount of bilirubin; otherwise, was a pretty unremarkable urine.  A CMET   was done on February 22, and showed a glucose of 128, and the rest of   the CMET was normal.      For the hematuria, 1 small stone seen; the urinalysis was, otherwise,   negative.  This was clearly not the cause of his lower abdominal pain.   Therefore antibiotics were stopped, and will follow up a urinalysis as   an outpatient at some point.      For his colon cancer status post ostomy and failed takedown, he received     routine ostomy care in the hospital.      For obstructive sleep apnea, he remained on his CPAP at night, and we   made it okay for him to use his own machine which he tolerated very   well.      On August 10, 2008, a left leg venous duplex was completed with   evidence of extensive DVT coursing from calf vein into popliteal vein   and femoral vein to distal common   femoral vein.  No SVT or Baker's cyst   was noted.  One more Doppler will be obtained before he leaves the   hospital.      On August 15, 2008, he is feeling well.  No chest pain or shortness of   breath.  His left groin swelling and issues have decreased nicely.  His   leg is doing better.  His vital signs are normal.  His physical exam is   improved markedly from admission.  His INR is 2.9.  Therefore he will be   discharged today on Coumadin 5 mg per day, and follow up with my office   at my Coumadin Clinic.  He will remain on Coumadin for the rest of his   life.               John M Russo, MD   Electronically Signed            JMR/MEDQ  D:  08/15/2008  T:  08/15/2008  Job:  [id number]      cc:   [facility]  

## 2010-07-17 NOTE — Discharge Summary (Signed)
Summary: Discharge summary   NAME:  [name], [name]             ACCOUNT NO.:  [id number]   MEDICAL RECORD NO.:  [id number]          PATIENT TYPE:  INP   LOCATION:  3740                         FACILITY:  [facility]   PHYSICIAN:  Sharlet Salina T. Hoxworth, M.D.DATE OF BIRTH:  1933/07/01   DATE OF ADMISSION:  01/21/2006  DATE OF DISCHARGE:  02/01/2006                               DISCHARGE SUMMARY   DISCHARGE DIAGNOSES:  1. Status post Hartmann colectomy for cancer of the colon.  2. Ventral incisional hernia.  3. Postoperative pulmonary embolus.   OPERATIONS AND PROCEDURES:  Exploratory laparotomy with attempt at  takedown of Hartmann colostomy and repair of parastomal and ventral  incisional hernias with biologic mesh on January 21, 2006.   CONSULTATIONS:  With Dr. Emilia Beck.   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  This required a Hartmann resection as well as an ilio-  cecectomy to direct invasion of the cecum by the tumor.  He then had  postoperative radiation therapy.  He was initially referred to me  earlier this year for consideration for colostomy takedown after seeing  Dr. Marina Goodell for routine follow-up.  He states that takedown was briefly  discussed with him years ago in [place], but was not undertaken for  reasons at this point are not completely clear.  The patient also has  known ventral and parastomal hernia, and has had at least one episode of  recent small-bowel obstruction, confirmed by CT.  He has had a  preoperative workup including barium enema through the stoma and through  his rectum which shows a somewhat narrowed, as expected from disease,  but otherwise normal appearing rectal pouch.  After extensive discussion  in the office, we have elected to proceed with takedown of his [name]  colostomy and repair of his ventral and parastomal hernias.   Following  mechanical antibiotic bowel prep at home, he is admitted for the  procedure.   PAST MEDICAL HISTORY:  Significant mainly only as above.  He has had  sleep apnea for which he uses the CPAP and mildly elevated cholesterol.   MEDICATIONS:  1. Lipitor.  2. Baby aspirin.   ALLERGIES:  DEMEROL AND PHENERGAN COMBINATION CAUSE CAUSED A DYSPHORIC  REACTION.   SOCIAL HISTORY/FAMILY HISTORY/ REVIEW OF SYSTEMS:  Noncontributory.  See  admission H&P.   PHYSICAL EXAMINATION:  VITAL SIGNS:  Within normal limits.  GENERAL:  He is a mildly overweight, white male in no acute distress.  ABDOMEN:  Pertinent findings are limited to the abdomen.  He has a  healthy appearing colostomy to the left lower quadrant.  Moderate  ventral incisional and parastomal hernias are noted.  RECTAL:  Some tenderness, but no other abnormalities.   HOSPITAL COURSE:  The patient was admitted on the morning of his  procedure.  At the time of exploration, we were able to dissect his  rectal pouch which was quite low, but after dissecting this free the  wall was markedly thickened  with a very narrow lumen secondary to  apparent radiation enteritis.  This really was not completely evident on  his preoperative barium enema, but after a long time of working with  this and dissecting around the rectal stump, we could not find adequate  bowel for anastomosis and felt it would be highly unlikely to succeed  should we attempt anastomosis to this abnormal bowel, and takedown of  his [name] colostomy was unfortunately abandoned.  He did not then  undergo lysis of adhesions, repair of his ventral and parastomal  hernias, using MTF acellular dermis.  His initial postoperative course  was benign.  He was observed overnight in the intensive care unit due to  his sleep apnea and then transferred to the floor on the first  postoperative day, and clear liquid diet was started.  Diet was  advanced.  He was transferred oral  pain medications and appeared to be  doing well initially.  However he did develop some increased abdominal  distension and nausea noted on the third and fourth postoperative days.  He was not having ostomy output, and he was observed on a liquid diet.  On the fifth postoperative day, however, the patient developed acute  shortness of breath and spiral CT scan showed large bilateral pulmonary  emboli.  He was hypoxemic and was transferred to the intensive care unit  for observation and started on IV heparin.  Dr. Kennedy Bucker from Internal  Medicine followed the patient closely.  He remained hemodynamically  stable and his oxygenation gradually improved.  He was started on p.o.  Coumadin on August 14.  At this point, he was having colostomy output,  and his diet was advanced.  He is transferred back to the floor on  August 15 with no shortness of breath, chest pain and normal O2 sat's.  He was advanced to a regular diet which he appeared to tolerate well  initially, and discharge was anticipated.  He continued to improve and  was discharged home on February 01, 2006.   DISCHARGE MEDICATIONS:  The same as admission plus Coumadin.   FOLLOWUP:  Follow-up is to be for his anticoagulation by Dr. Kennedy Bucker.   DISCHARGE INSTRUCTIONS:  He is on a regular diet.   DISCHARGE MEDICATIONS:  1. Tylox for pain.  2. Coumadin dosing per Dr. Kennedy Bucker.  3. Admission medications.   PLAN:  Staples removed to wound healing primarily.      [name]. Hoxworth, [name]  Electronically Signed     BTH/MEDQ  D:  04/07/2006  T:  04/08/2006  Job:  [id number]

## 2010-07-17 NOTE — Letter (Signed)
Summary: Patient Notice- Colon Biospy Results  [facility]  [address] [place], [place] [address]   Phone: [phone]  Fax: [phone]        Oct 21, 2009 MRN: [id number]    [facility] [address] [place], [place]  [address]    Dear Mr. [facility],  The biopsies taken during your recent colonoscopy revealed benign inflammation in the ileum (small bowel). The cause is not certain, but may relate to some of your abdominal complaints recently.  Additional information/recommendations:   __Continue with the treatment plan as outlined on the day of your      exam, including office follow up.  __You should have a repeat colonoscopy examination in 5 years.    Please call us if you are having persistent problems or have questions about your condition that have not been fully answered at this time.  Sincerely,  Hilarie Fredrickson MD   This letter has been electronically signed by your physician.  Appended Document: Patient Notice- Colon Biospy Results letter mailed

## 2010-07-17 NOTE — Discharge Summary (Signed)
Summary: discharge summary   NAME:  [name], [name]             ACCOUNT NO.:  [id number]      MEDICAL RECORD NO.:  [id number]          PATIENT TYPE:  INP      LOCATION:  3031                         FACILITY:  [facility]      PHYSICIAN:  Gwen Pounds, MD       DATE OF BIRTH:  Apr 01, 1934      DATE OF ADMISSION:  08/08/2008   DATE OF DISCHARGE:                                  DISCHARGE SUMMARY      PRIMARY CARE PROVIDER:  Myself.      DISCHARGE DIAGNOSES:   1. Extensive left lower extremity deep venous thrombosis including       left common femoral vein, superficial femoral vein, popliteal vein,       origins of the great saphenous and proximal profunda femoral veins.   2. Hypercholesterolemia.   3. History of colon cancer status post ostomy.   4. Status post failed takedown that was complicated by a deep venous       thrombosis and pulmonary emboli and an inferior vena cava filter       placement.   5. Status post Coumadin treatment for the deep venous thrombosis and       pulmonary embolus for 1 year.  The patient elected to come off of       this about 1 year ago.   6. Groin pain and lower extremity edema secondary to the new acute       deep venous thrombosis.   7. Obstructive sleep apnea on continuous positive airway pressure of       8.   8. Insomnia.  Uses Tylenol PM at home on occasion.   9. Hyperglycemia without diabetes.   10.Hematuria for urgent care, but he had either a kidney stone versus       a urinary tract infection for which he was placed on antibiotics.       Repeat urinalysis is negative, and computed tomography scan showed       small left renal calculus.   11.History of small bowel obstruction secondary to pelvic adhesions       and a small pelvic abscess status post laparotomy, lysis of       adhesions, and drainage of the abscess on February 13, 2006.   12.Colostomy.   13.Radiation treatment and chemotherapy in 2000 for the colon cancer.      DISCHARGE  MEDICATION LIST:   1. Coumadin 5 mg p.o. daily.   2. Simvastatin 10 mg p.o. daily.   3. Fish oil 1 tablet p.o. daily.   4. Aspirin 81 mg p.o. daily.   5. Stool softener daily.   6. Gas-X 1-2 per day.   7. CPAP as directed at 8 cmH20.      AFTERCARE FOLLOW-UP INSTRUCTIONS:  He is to follow up with me as   directed in my Coumadin clinic.  He will be on Coumadin for lifelong   discharge procedures to include:      1. An outpatient left lower extremity venous duplex Doppler  ultrasound.  I tried imaging on August 08, 2008 with findings       indicative of extensive deep venous thrombosis of the left lower       extremity.   2. CT scan on August 08, 2008 of the abdomen and pelvis with and       without contrast showing small left renal calculus without       obstructive changes.  Cholelithiasis without acute pericholecystic       inflammatory type changes.  Surgical changes of the lower pelvis       and left-sided colostomy.  Marked increased densities of the lower       pelvic mesenteric fat suggest inflammatory changes of intermediate       chronicity.  Consider postsurgical changes, radiation changes,       and/or acute inflammatory process.  Contracted urinary bladder with       diffusely thickened bladder wall.  Easy increased density of the       left inguinal crease suggest inflammatory change.  Asymmetric       increased left common iliac vein size with diminished postcontrast       enhancement.  Consider DVT given the history of the acute left leg       swelling and pain.  Vena cava filter noted.      HISTORY OF PRESENT ILLNESS:  Briefly, [name] is a 77 year old male   with past medical history of colon cancer status post surgery,   chemotherapy, and radiation with colostomy placement.  He tried to   undergo a colostomy takedown which was unsuccessful, and unfortunately   was complicated with the DVT and pulmonary emboli.  He took Coumadin for   over a year, and  despite knowing the risks and benefits decided that he   wanted to come off.  He came off about a year ago.  He presented to   medical attention on the day prior to admission on August 07, 2008 at   [facility] for lower abdominal pain, difficulty urinating,   and other lower abdominal and left lower quadrant issues.  At urgent   care it was not quite clear what was going on.  He had a urinalysis   which showed hematuria.  He was treated with Darvocet and Cipro.  He was   told to increase his fluid intake, and follow up with me in 24-48 hours   and go to the ER if symptoms worsen.  The next day he showed up in the   office.  On review of his history and exam, he had increasing swelling   over the night before and with his history a DVT was strongly   considered.  I could not figure out why he was having so much abdominal   pain except that if the DVT extended up into the iliacs and maybe   femoral veins and with a history of the hematuria, I had lots of   potential concerns.  Therefore I went ahead and did the CT scan.  The CT   scan report as above.  This was followed up with the lower extremity   Doppler, and due to the massive and extensive DVT he was put in the   hospital for heparin and Coumadin overlap.      HOSPITAL COURSE:  [name] was admitted through my office on   August 08, 2008 for an extensive left lower extremity DVT that  extended up into his left lower quadrant and iliac veins.  Over the next   several days on the heparin and Coumadin, his left lower extremity   swelling diminished.  His left inguinal and abdominal pains diminished.   His ability to get up and move around improved.  At no time did he have   any cellulitis.  At no time did he have any loss of dorsalis pedal   pulses.  Over the first few days I had debated whether he would be a   good candidate for a direct catheter lytic-type procedure to reduce the   clot burden.  Fortunately  for the patient he did not have any phlegmasia   or any true indication due to this.  He did have the filter in place   which was reassuring.  He never had any chest pain, shortness of breath   or hypoxia throughout his whole hospital stay.  Due to the massive   nature of this clot, I elected to keep him on heparin for 7 full days   and Coumadin.  His INR has been therapeutic for the last couple days,   and I did follow up with 1 lower extremity Doppler while in the hospital   and this did continue to show massive amounts of clot.  I think due to   the nature of his clot I think that continuing to follow this up on a   periodic basis until a decent amount of resolution has passed is   worthwhile.  He will now need to be on Coumadin lifelong, and I expect   some left lower extremity venous damage with chronic lower extremity   edema, maybe some venous stasis after this whole process is complete.      As far as his vitals, have remained stable throughout the hospital stay.   His physical exam and his mood remained appropriate.  He did not have   any major issues.  A chest x-ray was done on February 23 revealing no   active lung disease.  Labs were unremarkable throughout his stay.  His   INR was 2.9 on March 1.  White count was 7.8, hemoglobin 12.2, platelet   count was 222.  A urine culture was done and was negative.  Urinalysis   was done on February 23 which showed no blood, no protein, a small   amount of bilirubin; otherwise, was a pretty unremarkable urine.  A CMET   was done on February 22, and showed a glucose of 128, and the rest of   the CMET was normal.      For the hematuria, 1 small stone seen; the urinalysis was, otherwise,   negative.  This was clearly not the cause of his lower abdominal pain.   Therefore antibiotics were stopped, and will follow up a urinalysis as   an outpatient at some point.      For his colon cancer status post ostomy and failed takedown, he received     routine ostomy care in the hospital.      For obstructive sleep apnea, he remained on his CPAP at night, and we   made it okay for him to use his own machine which he tolerated very   well.      On August 10, 2008, a left leg venous duplex was completed with   evidence of extensive DVT coursing from calf vein into popliteal vein   and femoral vein to distal common  femoral vein.  No SVT or Baker's cyst   was noted.  One more Doppler will be obtained before he leaves the   hospital.      On August 15, 2008, he is feeling well.  No chest pain or shortness of   breath.  His left groin swelling and issues have decreased nicely.  His   leg is doing better.  His vital signs are normal.  His physical exam is   improved markedly from admission.  His INR is 2.9.  Therefore he will be   discharged today on Coumadin 5 mg per day, and follow up with my office   at my Coumadin Clinic.  He will remain on Coumadin for the rest of his   life.               Gwen Pounds, MD   Electronically Signed            JMR/MEDQ  D:  08/15/2008  T:  08/15/2008  Job:  [id number]      cc:   [facility]

## 2010-07-17 NOTE — Procedures (Signed)
Summary: colonoscopy   Colonoscopy  Procedure date:  01/11/2005  Findings:      Location:  [facility].  Results: Normal.   Comments:      Repeat colonoscopy in 3 years.    Patient Name: Salaam, Battershell MRN: [id number] Procedure Procedures: Colonoscopy CPT: 40981.  Personnel: Endoscopist: Wilhemina Bonito. Marina Goodell, MD.  Exam Location: Exam performed in Outpatient Clinic. Outpatient  Patient Consent: Procedure, Alternatives, Risks and Benefits discussed, consent obtained, from patient. Consent was obtained by the RN.  Indications  Surveillance of: Colorectal Cancer. The patient has had prior cancer staging. This is not an initial surveillance exam. Index tumor removed in 2000, Index cancerous lesion was  located in distal colon. Previous surveillance exam(s) in  2003, Previous surveillance exam(s) in  2004,  History  Current Medications: Patient is not currently taking Coumadin.  Pre-Exam Physical: Performed Jan 11, 2005. Cardio-pulmonary exam, Rectal exam, HEENT exam  WNL. Abdominal exam abnormal. Extremity exam, Mental status exam WNL. Abnormal PE findings include: COLOSTOMY.  Exam Exam: Extent of exam reached: Terminal Ileum, extent intended: Terminal Ileum.  The cecum was identified by appendiceal orifice and IC valve. Patient position: left side to back. Colon retroflexion performed. Images taken. ASA Classification: II. Tolerance: excellent.  Monitoring: Pulse and BP monitoring, Oximetry used. Supplemental O2 given.  Colon Prep Used MIRALAX for colon prep. Prep results: excellent.  Sedation Meds: Patient assessed and found to be appropriate for moderate (conscious) sedation. Fentanyl 50 mcg. given IV. Versed 6 mg. given IV.  Findings NORMAL EXAM: Ascending Colon to Descending Colon.  - PRIOR SURGERY: Cecum. Segmental Colectomy.  - PRIOR SURGERY: Sigmoid Colon. Segmental Colectomy.  - NORMAL EXAM: Sigmoid Colon to Rectum. Comments: NORMAL  HARTMAN'S POUCH. ABOUT 10 CM IN LENGTH.MILD MUCOSAL ERYTHEMA PRESENT.   Assessment  Comments: PRIOR SURGERY NO POLYPS OR CANCER SEEN Events  Unplanned Interventions: No intervention was required.  Unplanned Events: There were no complications. Plans Disposition: After procedure patient sent to recovery. After recovery patient sent home.  Scheduling/Referral: Colonoscopy, to Wilhemina Bonito. Marina Goodell, MD, IN 3 YEARS,    This report was created from the original endoscopy report, which was reviewed and signed by the above listed endoscopist.   cc:  Creola Corn, MD      Arlan Organ, MD      The Patient

## 2010-07-17 NOTE — Miscellaneous (Signed)
  Clinical Lists Changes  Orders: Added new Test order of TLB-CBC Platelet - w/Differential (85025-CBCD) - Signed Added new Test order of TLB-CRP-High Sensitivity (C-Reactive Protein) (86140-FCRP) - Signed

## 2010-07-17 NOTE — Procedures (Signed)
Summary: Colonoscopy   Colonoscopy  Procedure date:  07/28/2002  Findings:      Location:  [facility].  Results: Colitis.         Comments:      Repeat colonoscopy in 1 year.     Procedures Next Due Date:    Colonoscopy: 07/2003 Patient Name: [name] [name] MRN: [id number] Procedure Procedures: Colonoscopy CPT: 40981.  Colonoscopy per stoma. CPT: K3296227.  Personnel: Endoscopist: Wilhemina Bonito. Marina Goodell, MD.  Referred By: Creola Corn, MD.  Exam Location: Exam performed in Outpatient Clinic. Outpatient  Patient Consent: Procedure, Alternatives, Risks and Benefits discussed, consent obtained, from patient. Consent was obtained by the RN.  Indications  Surveillance of: Colorectal Cancer. The patient has had prior cancer staging. This is not an initial surveillance exam. Index tumor removed in 2000, month of Dec. Index cancerous lesion was  located in distal colon.  History  Pre-Exam Physical: Performed Jul 28, 2002. Entire physical exam was normal.  Exam Exam: Extent of exam reached: Anastamosis Site, extent intended: Anastamosis Site.  Patient position: left side to back. Colon retroflexion not performed. Images taken. ASA Classification: III. Tolerance: excellent.  Monitoring: Pulse and BP monitoring, Oximetry used. Supplemental O2 given.  Colon Prep Used Golytely for colon prep. Prep results: excellent.  Sedation Meds: Patient assessed and found to be appropriate for moderate (conscious) sedation. Fentanyl 50 mcg. given IV. Versed 5 mg. given IV.  Findings NORMAL EXAM: Transverse Colon.  OTHER FINDING: Anastomosis with a few ileal erosions found in Ascending Colon.  - PRIOR SURGERY: Ileum. Right Hemicolectomy.  - PRIOR SURGERY: Descending Colon. Segmental Colectomy.  - MUCOSAL ABNORMALITY: Sigmoid Colon to Rectum. ICD9: Colitis, Unspecified: 558.9. Comments: 14cm rectal stump with diffuse mild diversion colitis. No polyps.   Assessment Abnormal  examination, see findings above.  Diagnoses: 558.9: Colitis, Unspecified.   Events  Unplanned Interventions: No intervention was required.  Unplanned Events: There were no complications. Plans Disposition: After procedure patient sent to recovery. After recovery patient sent home.  Scheduling/Referral: Colonoscopy, to Wilhemina Bonito. Marina Goodell, MD, in 1 year,    This report was created from the original endoscopy report, which was reviewed and signed by the above listed endoscopist.   cc:  Creola Corn, MD      Arlan Organ, MD      The Patient

## 2010-07-17 NOTE — Letter (Signed)
Summary: [facility]  [facility]   Imported By: Sherian Rein 11/01/2009 14:33:14  _____________________________________________________________________  External Attachment:    Type:   Image     Comment:   External Document

## 2010-07-17 NOTE — Assessment & Plan Note (Signed)
Summary: Abdominal discomfort, bleeding, colostomy   History of Present Illness Visit Type: consult  Primary GI MD: Yancey Flemings MD Primary Provider: Creola Corn, MD  Requesting Provider: Creola Corn, MD  Chief Complaint: Bloating, rectal bleeding, and hemorrhoids  History of Present Illness:   77 year old male with hypertension, hyperlipidemia, deep vein thrombosis with pulmonary embolus, recurrent deep vein thrombosis off Coumadin, now on chronic Coumadin therapy, and remote colon cancer for which she underwent resection with colostomy. Failed attempt at colostomy takedown due to adhesions and prior radiation. He presents today with complaints of lower bowel discomfort, bloating, difficulty with bowel movements, and rectal bleeding. He is accompanied by his wife. He has not been seen since July 2006 when he underwent his last surveillance colonoscopy. The rectal pouch revealed mild erythema. The remaining colon revealed evidence of prior surgery with out recurrent neoplasia. Followup in 3 years recommended. The patient has been over due to  interval medical problems. He describes his problems with lower abdominal discomfort for about one year. Also some discomfort around the stoma. Problem seems worse with difficulty with bowel movements. Dietary manipulation has not made any difference. No other exacerbating or relieving factors. No nausea or vomiting. Does have a prior history small bowel structural requiring surgery. His INR has been stable. He takes stool softeners daily for his bowels. He has known stenosis of the rectum which makes examinations uncomfortable.   GI Review of Systems    Reports abdominal pain and  bloating.     Location of  Abdominal pain: lower abdomen.    Denies acid reflux, belching, chest pain, dysphagia with liquids, dysphagia with solids, heartburn, loss of appetite, nausea, vomiting, vomiting blood, weight loss, and  weight gain.      Reports hemorrhoids and  rectal  bleeding.     Denies anal fissure, black tarry stools, change in bowel habit, constipation, diarrhea, diverticulosis, fecal incontinence, heme positive stool, irritable bowel syndrome, jaundice, light color stool, liver problems, and  rectal pain.    Current Medications (verified): 1)  Lipitor 10 Mg Tabs (Atorvastatin Calcium) .Marland Kitchen.. 1 Tablet By Mouth Once Daily 2)  Prozac 10 Mg Caps (Fluoxetine Hcl) .Marland Kitchen.. 1 Tablet By Mouth Once Daily 3)  Multivitamins  Tabs (Multiple Vitamin) .Marland Kitchen.. 1 Tablet By Mouth Once Daily 4)  Aspirin 81 Mg Tbec (Aspirin) .Marland Kitchen.. 1 Tablet By Mouth Once Daily 5)  Coumadin 5 Mg Tabs (Warfarin Sodium) .... One Tablet By Mouth Once Daily 6)  Fish Oil 1000 Mg Caps (Omega-3 Fatty Acids) .... One Tablet By Mouth Once Daily 7)  Stool Softener 100 Mg Caps (Docusate Sodium) .... One Tablet By Mouth Once Daily 8)  Gas-X 80 Mg Chew (Simethicone) .... One or Two Tablets By Mouth Once Daily 9)  Cpap .... As Directed 10)  Lisinopril 10 Mg Tabs (Lisinopril) .... One Tablet By Mouth Once Daily  Allergies (verified): 1)  ! Demerol 2)  ! Codeine 3)  ! Phenergan  Past History:  Past Medical History: colon cancer blood clot in lung colostomy DVT Sleep Apnea Small Bowel Obstruction Hypertension Hyperlipidemia  Past Surgical History: colon cancer 1999 lesions from prior surgery removed  twice 2008 Rt. sigmoid resection with colostomy rt. hemicolectomy Appendectomy  Family History: mother deceased - diabetic father deceased age 74 heart attack No FH of Colon Cancer:  Social History: denies smoking, drinking or recreational drug use Retired Married 2 childern  Review of Systems       The patient complains of fatigue, sleeping problems, and  swelling of feet/legs.  The patient denies allergy/sinus, anemia, anxiety-new, arthritis/joint pain, back pain, blood in urine, breast changes/lumps, change in vision, confusion, cough, coughing up blood, depression-new, fainting, fever,  headaches-new, hearing problems, heart murmur, heart rhythm changes, itching, menstrual pain, muscle pains/cramps, night sweats, nosebleeds, pregnancy symptoms, shortness of breath, skin rash, sore throat, swollen lymph glands, thirst - excessive , urination - excessive , urination changes/pain, urine leakage, vision changes, and voice change.    Vital Signs:  Patient profile:   77 year old male Height:      62 inches Weight:      199 pounds BMI:     36.53 BSA:     1.91 Pulse rate:   72 / minute Pulse rhythm:   regular BP sitting:   132 / 64  (left arm) Cuff size:   regular  Vitals Entered By: Ok Anis CMA (September 11, 2009 8:57 AM)  Physical Exam  General:  Well developed, well nourished, no acute distress. Head:  Normocephalic and atraumatic. Eyes:  PERRLA, no icterus. Nose:  No deformity, discharge,  or lesions. Mouth:  No deformity or lesions, Neck:  Supple; no masses or thyromegaly. Lungs:  Clear throughout to auscultation. Heart:  Regular rate and rhythm; no murmurs, rubs,  or bruits. Abdomen:  soft, nontender, nondistended. Multiple surgical incisions well-healed. No obvious hernia. Good bowel sounds heard. Colostomy in the left lower quadrant. Rectal:  deferred until colonoscopy. Msk:  Symmetrical with no gross deformities. Normal posture. Pulses:  Normal pulses noted. Extremities:  chronic stasis changes , left greater than right. Trace edema. Neurologic:  Alert and  oriented x4;  grossly normal neurologically. Skin:  Intact without significant lesions or rashes. Psych:  Alert and cooperative. Normal mood and affect.   Impression & Recommendations:  Problem # 1:  ABDOMINAL PAIN -GENERALIZED (ICD-789.07) generalized lower abdominal discomfort likely due to adhesions with possible contributions from constipation. We discussed the there is very little to offer in the way of adhesive disease, almost obstruction were to occur, or the discomfort was incapacitating (which it  is not). We will treat constipation bit more aggressively to see if this is helpful. See below.  Problem # 2:  CONSTIPATION (ICD-564.00) currently on stool softeners daily.  Plan: #1. Prescribe GlycoLax 17 g daily in 8 ounces of water. Titrate to need  Problem # 3:  RECTAL BLEEDING (ICD-569.3) intermittent minor rectal bleeding either due to known hemorrhoids or mild diversion colitis in a patient on Coumadin. Will evaluate further at the time of surveillance colonoscopy.  Problem # 4:  PERSONAL HISTORY MALIG NEOPLASM LARGE INTESTINE (ICD-V10.05) history of colon cancer status post resection x2 and colostomy with small rectal pouch.  Plan:  #1. Colonoscopy. The nature of the procedure as well as the risks, benefits, and alternatives have been reviewed. He understood and agreed to proceed. #2. Movi prep prescribed. Patient instructed on its use. #3. We discussed the pros and cons of coming off Coumadin therapy or Lovenox window. We will perform the examination on Coumadin. He will have his INR checked 2 days prior to colonoscopy to be sure that his INR is therapeutic  Problem # 5:  OTHER AND UNSPECIFIED COAGULATION DEFECTS (ICD-286.9) management issues of anticoagulation as it relates to his procedure.    see The above discussion.  Other Orders: Colonoscopy (Colon)  Patient Instructions: 1)  Colonoscopy [facility] 10/19/09 8:00 am 2)  Movi prep instructions given to patient. 3)  Movi prep Rx. sent to your pharmacy. 4)  Miralax Rx. sent to your pharmacy for you to pick up. 5)  Colonoscopy and Flexible Sigmoidoscopy brochure given.  6)  Patient is to stay on Coumadin Per Dr. Marina Goodell and have PT/INR checked 2 days prior to procedure. 10/17/09 at PCP. 7)  The medication list was reviewed and reconciled.  All changed / newly prescribed medications were explained.  A complete medication list was provided to the patient / caregiver. 8)  copy given to patient. Milford Cage [facility]  September 11, 2009 9:50  AM 9)  Copy sent to : Creola Corn, MD Prescriptions: Ethelene Hal  POWD (POLYETHYLENE GLYCOL 3350) 17 grams in 8 oz of water daily  #large bottle x 3   Entered by:   Milford Cage NCMA   Authorized by:   Hilarie Fredrickson MD   Signed by:   Milford Cage NCMA on 09/11/2009   Method used:   Print then Give to Patient   RxID:   [id number] MOVIPREP 100 GM  SOLR (PEG-KCL-NACL-NASULF-NA ASC-C) As per prep instructions.  #1 x 0   Entered by:   Milford Cage NCMA   Authorized by:   Hilarie Fredrickson MD   Signed by:   Milford Cage NCMA on 09/11/2009   Method used:   Print then Give to Patient   RxID:   [id number]

## 2010-07-17 NOTE — Letter (Signed)
[facility]   Imported By: Lester [place] 01/04/2010 08:37:18  _____________________________________________________________________  External Attachment:    Type:   Image     Comment:   External Document

## 2010-07-17 NOTE — Op Note (Signed)
Summary: operative report   NAME:  [name], [name]             ACCOUNT NO.:  [id number]   MEDICAL RECORD NO.:  [id number]          PATIENT TYPE:  INP   LOCATION:  5039                         FACILITY:  [facility]   PHYSICIAN:  Gwen Pounds, MD       DATE OF BIRTH:  09-01-1933   DATE OF ADMISSION:  11/25/2005  DATE OF DISCHARGE:  11/29/2005                                 DISCHARGE SUMMARY   PRIMARY CARE PROVIDER:  Gwen Pounds, [name]   PRIMARY SURGEON:  Sharlet Salina T. Hoxworth, [name]   GASTROENTEROLOGIST:  Wilhemina Bonito. Marina Goodell, [name] [facility].   DISCHARGE DIAGNOSES:  1. Partial small bowel obstruction and abdominal pain.  2. History of colon cancer, status post hemicolectomy.  3. Ileocolostomy.  4. Radiation treatment and chemotherapy in 2000.  5. Hyperlipidemia.  6. Obstructive sleep apnea on CPAP.  7. Depression.   DISCHARGE MEDICATIONS:  1. Lipitor 10 mg p.o. every day.  2. Multivitamin one p.o. every day.  3. Baby aspirin 81 mg, two tablets every day.  4. Fish oil 1 tablet per day.  5. CPAP 8-cmH2O as directed.   DISCHARGE PROCEDURES:  1. Consultation with [facility].  2. Acute abdominal series on November 25, 2005, showing partial small bowel      obstruction, no acute lung disease, several recurrent abdominal x-rays.  3. CT scan of the abdomen and pelvis done on June 12, revealing dilated      small bowel consistent with partial small bowel obstruction.  CT of the      pelvis revealed dilated small bowel, appears to change caliber in the      left mid pelvis where there is a small amount of fluid and surgical      clip adjacent, this favors adhesions.  Colostomy was noted in the left      lower quadrant with no adjacent herniation but there were two midline      lower abdominal hernia, one containing a portion of the mid transverse      colon without definitive obstruction.  The more caudal of the two      hernias appears to represent an area of fat necrosis with  subcutaneous      soft tissue stranding.  4. The last KUB on November 28, 2005, showed improving partial small bowel      obstruction since prior abdominal CT.  Otherwise no significant      abnormality.   HISTORY OF PRESENT ILLNESS:  Briefly, Mr. [name] is a 77 year old  male with hyperlipidemia, obstructive sleep apnea, and history of colon  cancer.  He is status post sigmoid hemicolectomy and ileocolostomy  placement.  He is status post radiation therapy and chemotherapy at the end  of 2000.  The patient called me today with abdominal pain, cramping, and  hypotension with blood pressure in the 74/50 range with worsening abdominal  distention, diaphoresis, weakness, and pelvic pressure.  He initially had no  nausea or vomiting but did have 2-3 episodes after the phone call just prior  to the presentation.  He was sent to  the ED today and was diagnosed with a  partial small bowel obstruction.  I was called for the patient's admission.  He was given IV fluids, Dilaudid, Zofran in the emergency department.  His  main symptoms initially started in the right lower quadrant pain and started  at 6 a.m. on the date of admission.  His symptoms got markedly worse after  he had a sandwich around lunchtime.  While getting him to do the abdominal  series, he actually vomited in the x-ray room and felt better since.   PHYSICAL EXAMINATION:  VITAL SIGNS:  Revealed temperature 96.9, blood  pressure is 81-98 range, heart rate was 54, respiratory rate was 24, sating  98%.  Physical exam was unremarkable until we get to the abdominal exam.  ABDOMEN:  He was distended.  A surgical scar was noted.  No rebound or  guarding noted.  Ostomy was noted.  He had hyperactive bowel sounds and a  large ventral hernia.   His white count was 14.9 thousand and his liver tests were within normal  limits, except for an increased total bilirubin of 1.3, lipase was 25.  The  acute abdominal series showed the  partial small bowel obstruction as stated  above and there was some air/fluid levels.   HOSPITAL COURSE:  This is a 77 year old male with extensive abdominal  history with surgery, radiation for colon cancer who now presents and was  admitted on November 25, 2005, for a partial small bowel obstruction.  He was  admitted, placed on bowel rest, surgical evaluation was obtained.  KUBs were  followed.  He was placed on medications for nausea and pain control.  He was  placed on CPAP for his obstructive sleep apnea.  We discussed the  possibility of what is causing this as far as a potential repeat recurrence  of cancer but that is a low  on the differential but adhesions related to  scar tissue versus radiation or his major ventral hernia were considered to  be the strong contenders.  He was seen in consultation by general surgery  and symptomatically after the vomiting his partial small bowel obstruction  was felt to be clinically improving.  They felt that we should keep him on  NPO and hold the NG tube for now since he is feeling better and consider  doing a CT scan but follow an acute abdominal series.  The following day, we  allowed him to have some sips and chips and his white count came down to  11.1 without any use of antibiotics.  His KUB remained looking like a  partial small bowel obstruction.  He tolerated the sips and chips okay and  the following day, because he was still pretty distended, it was determined  to go ahead and check the CT scan.  The CT scan results are listed above.   Of note, his radiation oncologist is Dr. Maia Petties, telephone number [phone].  The address is [address], [place], [place].   In the past, the patient reports that Dr. Piedad Climes told him that a take down  of his ostomy was not a real possibility.  [facility] did not  understand this and felt that he may be a good candidate for a ventral  hernia repair, adhesiolysis, and  possible take down and reanastomoses of his colon to his anus.  Recent evaluation by Dr. Marina Goodell, show that there was  enough of a rectum and an anus to reanastomose  him and I brought this up  with the patient in the past and that is why he had seen Dr. Johna Sheriff as  well as in the outpatient setting.  After the patient's small bowel  obstruction is resolved, he will be following up with the general surgeon to  decide if this is a possibility to do as an outpatient.  Dr. Zachery Dakins  reviewed the x-rays and had a long discussion with the patient and the  family and felt strongly that that potential surgery could take place in the  future and again wanted Dr. Johna Sheriff to review all the data as an  outpatient.   On November 28, 2005, he started passing gas.  His abdominal exam became softer.  He was started on clear liquids.  His IV fluids were turned down.  On November 29, 2005, he had no further nausea and vomiting.  He started passing liquid  stool.  His x-ray showed improvement, so it was felt that his partial small  bowel obstruction was presumed from adhesions was clinically resolving.  He  was allowed to eat more.  He tolerated his meals and it was felt that he was  okay to be discharged to home on a low residue, heart healthy diet and to  follow up with Dr. Johna Sheriff as an outpatient.  And, he was allowed to go  home in stable condition.  All vital signs were normal and his B-MET CBC  were all back in the normal range.  The patient understood the plan,  accepted it, and is looking forward to his discussions with general surgery.   He will follow with me as needed.  He will follow with Dr. Zachery Dakins and Dr. Johna Sheriff in the next couple of  weeks.   He will present back to the hospital should his symptoms return.      Gwen Pounds, MD  Electronically Signed     JMR/MEDQ  D:  12/21/2005  T:  12/21/2005  Job:  [id number]

## 2010-07-17 NOTE — Initial Assessments (Signed)
Summary: Consultation   NAME:  [name], [name] NO.:  [id number]   MEDICAL RECORD NO.:  [id number]          PATIENT TYPE:  INP   LOCATION:  5039                         FACILITY:  [facility]   PHYSICIAN:  Maisie Fus A. Cornett, M.D.DATE OF BIRTH:  [birth year]   DATE OF CONSULTATION:  11/25/2005  DATE OF DISCHARGE:                                   CONSULTATION   REFERRING PHYSICIAN:  Gwen Pounds, MD   REASON FOR CONSULTATION:  Abdominal pain.   HISTORY OF PRESENT ILLNESS:  The patient is a 77 year old male with a 1-day  history of progressive abdominal pain, nausea and vomiting.  He said he felt  well until this morning.  When he got up, he had more right lower quadrant  abdominal pain that has progressed to more diffuse pain.  He became quite  miserable and saw his doctor and has been to the hospital.  He had one large  episode of emesis and this made him feel much better.  Now he is pain free  and has no significant complaints.  The pain did not radiate, but it was  severe on a scale of 10/10, he states.  Again, it started in his right lower  quadrant and did not radiate.  He has a history of what sounds like low  anterior resection with an end-sigmoid colostomy.  He has been seen by Dr.  Johna Sheriff for takedown of his ostomy in the past, but this has been some time  ago.  He has a history of chemotherapy and radiation therapy, but this was  all done in [place] and I have no records of this.   PAST MEDICAL HISTORY:  1.  History of rectal cancer.  2.  History of radiation therapy.  3.  History of chemotherapy.  4.  Hyperlipidemia.  5.  Depression.   PAST SURGICAL HISTORY:  Please see above.  The patient does have what  appears to be an end-sigmoid colostomy.   MEDICATIONS:  Lipitor, CPAP, fish oil, aspirin and multivitamin.   ALLERGIES:  PHENERGAN AND DEMEROL.   SOCIAL HISTORY:  He is married with two children.   REVIEW OF SYSTEMS:  As stated above, otherwise  10-point review of systems  negative.   PHYSICAL EXAMINATION:  VITAL SIGNS:  Temperature 96, heart rate 60, blood  pressure 130/60.  GENERAL:  Pleasant male in no apparent distress.  HEENT:  Extraocular movements intact.  No scleral icterus.  Oropharynx  clear.  NECK:  Supple.  Nontender, no mass.  CHEST:  Clear to auscultation.  Chest wall motion is normal.  CARDIAC:  Regular rate and rhythm with good peripheral circulation.  ABDOMEN:  Soft with epigastric reducible ventral hernia noted which is  nontender.  Belly overall was nontender without mass or lesion.  There is no  rebound or guarding.  Surgical scars are noted from previous surgery.  EXTREMITIES:  Muscle tone is normal.  Range of motion is normal.   LABORATORY DATA AND X-RAY FINDINGS:  Acute abdominal series reveals small  bowel and stool in the colon consistent with a partial small bowel  obstruction.   White count 14,000 with a left shift, hemoglobin elevated at 15.  Electrolytes are within normal limits with a BUN of 19, creatinine 1.3.  Lipase normal with glucose 128.   IMPRESSION:  Partial small bowel obstruction.   RECOMMENDATIONS:  At this point in time, he is feeling fairly well.  I will  continue n.p.o. overnight.  Will repeat the acute abdominal series in the  morning and decide if a CT scan is appropriate in this gentleman.  He may  need further work up for this in the future, but currently seems to be doing  better and would do nothing else except what is being done currently.   Thank you for this consultation.      Thomas A. Cornett, [name]  Electronically Signed     TAC/MEDQ  D:  11/25/2005  T:  11/26/2005  Job:  [id number]

## 2010-09-27 LAB — PROTIME-INR: INR: 2.9 — ABNORMAL HIGH (ref 0.00–1.49)

## 2010-09-27 LAB — CBC
HCT: 35.4 % — ABNORMAL LOW (ref 39.0–52.0)
MCHC: 34.6 g/dL (ref 30.0–36.0)
Platelets: 222 10*3/uL (ref 150–400)
RDW: 12.8 % (ref 11.5–15.5)

## 2010-10-02 LAB — PROTIME-INR
INR: 1.2 (ref 0.00–1.49)
INR: 1.2 (ref 0.00–1.49)
INR: 1.3 (ref 0.00–1.49)
INR: 1.8 — ABNORMAL HIGH (ref 0.00–1.49)
INR: 2.2 — ABNORMAL HIGH (ref 0.00–1.49)
INR: 2.3 — ABNORMAL HIGH (ref 0.00–1.49)
INR: 2.9 — ABNORMAL HIGH (ref 0.00–1.49)
Prothrombin Time: 17 seconds — ABNORMAL HIGH (ref 11.6–15.2)

## 2010-10-02 LAB — COMPREHENSIVE METABOLIC PANEL
Albumin: 3.9 g/dL (ref 3.5–5.2)
BUN: 17 mg/dL (ref 6–23)
Calcium: 9.5 mg/dL (ref 8.4–10.5)
Creatinine, Ser: 1.07 mg/dL (ref 0.4–1.5)
Glucose, Bld: 128 mg/dL — ABNORMAL HIGH (ref 70–99)
Total Protein: 7.4 g/dL (ref 6.0–8.3)

## 2010-10-02 LAB — CBC
HCT: 34.8 % — ABNORMAL LOW (ref 39.0–52.0)
HCT: 34.9 % — ABNORMAL LOW (ref 39.0–52.0)
HCT: 35.1 % — ABNORMAL LOW (ref 39.0–52.0)
HCT: 36.3 % — ABNORMAL LOW (ref 39.0–52.0)
HCT: 36.5 % — ABNORMAL LOW (ref 39.0–52.0)
HCT: 37.4 % — ABNORMAL LOW (ref 39.0–52.0)
HCT: 38.1 % — ABNORMAL LOW (ref 39.0–52.0)
HCT: 39.1 % (ref 39.0–52.0)
HCT: 42.8 % (ref 39.0–52.0)
Hemoglobin: 12.1 g/dL — ABNORMAL LOW (ref 13.0–17.0)
Hemoglobin: 12.1 g/dL — ABNORMAL LOW (ref 13.0–17.0)
Hemoglobin: 12.3 g/dL — ABNORMAL LOW (ref 13.0–17.0)
Hemoglobin: 12.7 g/dL — ABNORMAL LOW (ref 13.0–17.0)
Hemoglobin: 12.9 g/dL — ABNORMAL LOW (ref 13.0–17.0)
Hemoglobin: 12.9 g/dL — ABNORMAL LOW (ref 13.0–17.0)
Hemoglobin: 13.6 g/dL (ref 13.0–17.0)
Hemoglobin: 14.8 g/dL (ref 13.0–17.0)
MCHC: 34.7 g/dL (ref 30.0–36.0)
MCHC: 34.9 g/dL (ref 30.0–36.0)
MCHC: 35.3 g/dL (ref 30.0–36.0)
MCV: 92.1 fL (ref 78.0–100.0)
MCV: 93.2 fL (ref 78.0–100.0)
MCV: 93.8 fL (ref 78.0–100.0)
Platelets: 132 10*3/uL — ABNORMAL LOW (ref 150–400)
Platelets: 136 10*3/uL — ABNORMAL LOW (ref 150–400)
Platelets: 139 10*3/uL — ABNORMAL LOW (ref 150–400)
Platelets: 147 10*3/uL — ABNORMAL LOW (ref 150–400)
RBC: 3.77 MIL/uL — ABNORMAL LOW (ref 4.22–5.81)
RBC: 3.97 MIL/uL — ABNORMAL LOW (ref 4.22–5.81)
RBC: 3.99 MIL/uL — ABNORMAL LOW (ref 4.22–5.81)
RBC: 4.14 MIL/uL — ABNORMAL LOW (ref 4.22–5.81)
RDW: 12.8 % (ref 11.5–15.5)
RDW: 12.8 % (ref 11.5–15.5)
RDW: 12.9 % (ref 11.5–15.5)
RDW: 12.9 % (ref 11.5–15.5)
RDW: 13 % (ref 11.5–15.5)
RDW: 13.1 % (ref 11.5–15.5)
RDW: 13.1 % (ref 11.5–15.5)
WBC: 10.1 10*3/uL (ref 4.0–10.5)
WBC: 11.1 10*3/uL — ABNORMAL HIGH (ref 4.0–10.5)
WBC: 7.8 10*3/uL (ref 4.0–10.5)
WBC: 8.5 10*3/uL (ref 4.0–10.5)

## 2010-10-02 LAB — DIFFERENTIAL
Basophils Relative: 0 % (ref 0–1)
Lymphocytes Relative: 14 % (ref 12–46)
Monocytes Absolute: 1 10*3/uL (ref 0.1–1.0)
Monocytes Relative: 8 % (ref 3–12)
Neutro Abs: 8.5 10*3/uL — ABNORMAL HIGH (ref 1.7–7.7)
Neutrophils Relative %: 75 % (ref 43–77)

## 2010-10-02 LAB — HEPARIN LEVEL (UNFRACTIONATED)
Heparin Unfractionated: 0.24 IU/mL — ABNORMAL LOW (ref 0.30–0.70)
Heparin Unfractionated: 0.31 IU/mL (ref 0.30–0.70)
Heparin Unfractionated: 0.38 IU/mL (ref 0.30–0.70)
Heparin Unfractionated: 0.47 IU/mL (ref 0.30–0.70)
Heparin Unfractionated: 0.61 IU/mL (ref 0.30–0.70)

## 2010-10-02 LAB — URINALYSIS, ROUTINE W REFLEX MICROSCOPIC
Glucose, UA: NEGATIVE mg/dL
Hgb urine dipstick: NEGATIVE
Specific Gravity, Urine: 1.03 (ref 1.005–1.030)

## 2010-10-02 LAB — URINE CULTURE
Colony Count: NO GROWTH
Culture: NO GROWTH
Special Requests: NEGATIVE

## 2010-10-02 LAB — APTT: aPTT: 38 seconds — ABNORMAL HIGH (ref 24–37)

## 2010-10-30 NOTE — Discharge Summary (Signed)
[name], [name] NO.:  [id number]   MEDICAL RECORD NO.:  [id number]          PATIENT TYPE:  INP   LOCATION:  3031                         FACILITY:  [facility]   PHYSICIAN:  [name] Pounds, MD       DATE OF BIRTH:  [birth year]   DATE OF ADMISSION:  08/08/2008  DATE OF DISCHARGE:                               DISCHARGE SUMMARY   PRIMARY CARE [name]:  Myself.   DISCHARGE DIAGNOSES:  1. Extensive left lower extremity deep venous thrombosis including      left common femoral vein, superficial femoral vein, popliteal vein,      origins of the great saphenous and proximal profunda femoral veins.  2. Hypercholesterolemia.  3. History of colon cancer status post ostomy.  4. Status post failed takedown that was complicated by a deep venous      thrombosis and pulmonary emboli and an inferior vena cava filter      placement.  5. Status post Coumadin treatment for the deep venous thrombosis and      pulmonary embolus for 1 year.  The patient elected to come off of      this about 1 year ago.  6. Groin pain and lower extremity edema secondary to the new acute      deep venous thrombosis.  7. Obstructive sleep apnea on continuous positive airway pressure of      8.  8. Insomnia.  Uses Tylenol PM at home on occasion.  9. Hyperglycemia without diabetes.  10.Hematuria for urgent care, but he had either a kidney stone versus      a urinary tract infection for which he was placed on antibiotics.      Repeat urinalysis is negative, and computed tomography scan showed      small left renal calculus.  11.History of small bowel obstruction secondary to pelvic adhesions      and a small pelvic abscess status post laparotomy, lysis of      adhesions, and drainage of the abscess on February 13, 2006.  12.Colostomy.  13.Radiation treatment and chemotherapy in 2000 for the colon cancer.   DISCHARGE MEDICATION LIST:  1. Coumadin 5 mg p.o. daily.  2. Simvastatin 10 mg p.o. daily.  3.  Fish oil 1 tablet p.o. daily.  4. Aspirin 81 mg p.o. daily.  5. Stool softener daily.  6. Gas-X 1-2 per day.  7. CPAP as directed at 8 cmH20.   AFTERCARE FOLLOW-UP INSTRUCTIONS:  He is to follow up with me as  directed in my Coumadin clinic.  He will be on Coumadin for lifelong  discharge procedures to include:   1. An outpatient left lower extremity venous duplex Doppler      ultrasound.  I tried imaging on August 08, 2008 with findings      indicative of extensive deep venous thrombosis of the left lower      extremity.  2. CT scan on August 08, 2008 of the abdomen and pelvis with and      without contrast showing small left renal calculus without      obstructive changes.  Cholelithiasis without acute pericholecystic      inflammatory type changes.  Surgical changes of the lower pelvis      and left-sided colostomy.  Marked increased densities of the lower      pelvic mesenteric fat suggest inflammatory changes of intermediate      chronicity.  Consider postsurgical changes, radiation changes,      and/or acute inflammatory process.  Contracted urinary bladder with      diffusely thickened bladder wall.  Easy increased density of the      left inguinal crease suggest inflammatory change.  Asymmetric      increased left common iliac vein size with diminished postcontrast      enhancement.  Consider DVT given the history of the acute left leg      swelling and pain.  Vena cava filter noted.   HISTORY OF PRESENT ILLNESS:  Briefly, [name] is a 77 year old male  with past medical history of colon cancer status post surgery,  chemotherapy, and radiation with colostomy placement.  He tried to  undergo a colostomy takedown which was unsuccessful, and unfortunately  was complicated with the DVT and pulmonary emboli.  He took Coumadin for  over a year, and despite knowing the risks and benefits decided that he  wanted to come off.  He came off about a year ago.  He presented to   medical attention on the day prior to admission on August 07, 2008 at  [facility] for lower abdominal pain, difficulty urinating,  and other lower abdominal and left lower quadrant issues.  At urgent  care it was not quite clear what was going on.  He had a urinalysis  which showed hematuria.  He was treated with Darvocet and Cipro.  He was  told to increase his fluid intake, and follow up with me in 24-48 hours  and go to the ER if symptoms worsen.  The next day he showed up in the  office.  On review of his history and exam, he had increasing swelling  over the night before and with his history a DVT was strongly  considered.  I could not figure out why he was having so much abdominal  pain except that if the DVT extended up into the iliacs and maybe  femoral veins and with a history of the hematuria, I had lots of  potential concerns.  Therefore I went ahead and did the CT scan.  The CT  scan report as above.  This was followed up with the lower extremity  Doppler, and due to the massive and extensive DVT he was put in the  hospital for heparin and Coumadin overlap.   HOSPITAL COURSE:  Mr. [name] was admitted through my office on  August 08, 2008 for an extensive left lower extremity DVT that  extended up into his left lower quadrant and iliac veins.  Over the next  several days on the heparin and Coumadin, his left lower extremity  swelling diminished.  His left inguinal and abdominal pains diminished.  His ability to get up and move around improved.  At no time did he have  any cellulitis.  At no time did he have any loss of dorsalis pedal  pulses.  Over the first few days I had debated whether he would be a  good candidate for a direct catheter lytic-type procedure to reduce the  clot burden.  Fortunately for the patient he did not have any phlegmasia  or  any true indication due to this.  He did have the filter in place  which was reassuring.  He never had  any chest pain, shortness of breath  or hypoxia throughout his whole hospital stay.  Due to the massive  nature of this clot, I elected to keep him on heparin for 7 full days  and Coumadin.  His INR has been therapeutic for the last couple days,  and I did follow up with 1 lower extremity Doppler while in the hospital  and this did continue to show massive amounts of clot.  I think due to  the nature of his clot I think that continuing to follow this up on a  periodic basis until a decent amount of resolution has passed is  worthwhile.  He will now need to be on Coumadin lifelong, and I expect  some left lower extremity venous damage with chronic lower extremity  edema, maybe some venous stasis after this whole process is complete.   As far as his vitals, have remained stable throughout the hospital stay.  His physical exam and his mood remained appropriate.  He did not have  any major issues.  A chest x-ray was done on February 23 revealing no  active lung disease.  Labs were unremarkable throughout his stay.  His  INR was 2.9 on March 1.  White count was 7.8, hemoglobin 12.2, platelet  count was 222.  A urine culture was done and was negative.  Urinalysis  was done on February 23 which showed no blood, no protein, a small  amount of bilirubin; otherwise, was a pretty unremarkable urine.  A CMET  was done on February 22, and showed a glucose of 128, and the rest of  the CMET was normal.   For the hematuria, 1 small stone seen; the urinalysis was, otherwise,  negative.  This was clearly not the cause of his lower abdominal pain.  Therefore antibiotics were stopped, and will follow up a urinalysis as  an outpatient at some point.   For his colon cancer status post ostomy and failed takedown, he received  routine ostomy care in the hospital.   For obstructive sleep apnea, he remained on his CPAP at night, and we  made it okay for him to use his own machine which he tolerated very   well.   On August 10, 2008, a left leg venous duplex was completed with  evidence of extensive DVT coursing from calf vein into popliteal vein  and femoral vein to distal common femoral vein.  No SVT or Baker's cyst  was noted.  One more Doppler will be obtained before he leaves the  hospital.   On August 15, 2008, he is feeling well.  No chest pain or shortness of  breath.  His left groin swelling and issues have decreased nicely.  His  leg is doing better.  His vital signs are normal.  His physical exam is  improved markedly from admission.  His INR is 2.9.  Therefore he will be  discharged today on Coumadin 5 mg per day, and follow up with my office  at my Coumadin Clinic.  He will remain on Coumadin for the rest of his  life.      [name] Pounds, MD  Electronically Signed     JMR/MEDQ  D:  08/15/2008  T:  08/15/2008  Job:  [id number]   cc:   [facility]

## 2010-10-30 NOTE — H&P (Signed)
[name], [name] NO.:  [id number]   MEDICAL RECORD NO.:  [id number]          PATIENT TYPE:  EMS   LOCATION:  MAJO                         FACILITY:  [facility]   PHYSICIAN:  Mark A. Perini, [name]   DATE OF BIRTH:  August 27, 1933   DATE OF ADMISSION:  08/08/2008  DATE OF DISCHARGE:                              HISTORY & PHYSICAL   CHIEF COMPLAINT:  Left lower abdominal pain and left lower extremity  swelling.   HISTORY OF PRESENT ILLNESS:  [name] is a pleasant 77 year old gentleman  with a past medical history as listed below.  He developed symptoms 3  days prior to admission and while he was walking in a parking lot he  developed a spasm feeling in his left groin and left lower abdominal  area.  It lasted briefly and then went away.  He actually was seen in  urgent care and had a little bit of hematuria and had some difficulty  urinating but no specific treatment was given with the exception of he  was covered with Cipro for a possibility of a urinary tract infection.  He presented to the office today with continued pain and left lower  extremity swelling and a CT scan of the abdomen and pelvis was performed  and this shows a large left lower extremity deep vein clot.  And he will  be admitted for further care of his deep vein thrombosis.  His current  pain reading is 7/10 in severity.  He denies any chest pains or  shortness of breath currently.  He also denies any fevers.   PAST HISTORY:  Colon cancer status post hemicolectomy of the sigmoid  with ileocolostomy. He had at [id number] tumor.  He did receive radiation  treatment and chemotherapy in 2000. A few years later in August of 2007  he had a failed ostomy takedown attempt and had a 40-day  hospitalization. He also had a ventral hernia repair in the past, and  this hospitalization in 2007 was complicated by pulmonary embolism, and  he also received an inferior vena cava filter at that time.  He took  Coumadin for  approximately a year and then discontinued this.  He has a  history of obstructive sleep apnea on CPAP and hyperlipidemia and some  depression.   ALLERGIES:  PHENERGAN with DEMEROL caused hallucinations.   CURRENT MEDICATIONS:  Include aspirin 81 mg daily, multivitamin daily,  fish oil 1000 mg daily, stool softener daily, simvastatin 10 mg daily,  Tylenol PM in the evening, and CPAP 8 cm water pressure nightly.   SOCIAL HISTORY:  He is married.  His wife is present with him.  He has 2  children and 2 grandchildren.  He is retired from [employer]. No tobacco, no alcohol, no drug use.   FAMILY HISTORY:  Father died at age 70 of an MI and coronary disease.  Mother died at age 59 of a stroke and cerebral hemorrhage. One child has  type 1 diabetes.   PHYSICAL EXAM:  VITAL SIGNS:  Pulse is 80, blood pressure 120/70.  GENERAL:  He is in  no acute distress, alert, oriented x3.  NECK:  Supple with no lymphadenopathy.  HEART: Regular rate and rhythm with no murmur, rub, or gallop.  LUNGS: Clear to auscultation bilaterally with no wheezes, rales or  rhonchi.  ABDOMEN: Soft, nontender, nondistended.  He has an ostomy which is  intact.  Rectal:  His rectal opening is closed.  EXTREMITIES:  He has 2+ left lower extremity edema with trace on the  right.  GENITOURINARY:  No hernia is noted.   LABORATORY DATA:  Glucose is 144, BUN 22, creatinine 1.1.  Sodium 138,  potassium 4.1, chloride 104, CO2 of 23, calcium 9.8.  White count is  12,000, __________%lymphocytes, and 8% monocytes.  CT scan of the  abdomen and pelvis performed today shows chronic changes related to his  colon cancer and previous colon cancer surgery.  He has a small kidney  stone with no hydronephrosis, but he does have extensive clot in the  left lower extremity.   ASSESSMENT/PLAN:  A 77 year old gentleman with apparently new left lower  extremity deep vein thrombosis in a patient with a history of deep vein   thrombosis and pulmonary embolus an IVC filter in place.  We gave him a  100 mg subcutaneous dose of Lovenox in the office, and he will be  admitted for heparin drip and Coumadin therapy.  He will be  nonweightbearing for the next few days.  We will continue his other  medications. Will continue his aspirin and we will add Protonix for GI  protection initially, and I will defer to his primary care physician  possibly discontinuing the aspirin at some point.  There is no evidence  of cellulitis or infection currently.  I will discontinue the Cipro at  for the time being and a repeat urinalysis could be obtained if  clinically indicated.  He is a full code status.      Mark A. Perini, [name]  Electronically Signed     MAP/MEDQ  D:  08/08/2008  T:  08/08/2008  Job:  [id number]

## 2010-11-02 NOTE — H&P (Signed)
NAMEMENDEL, BINSFELD NO.:  [id number]   MEDICAL RECORD NO.:  [id number]          PATIENT TYPE:  INP   LOCATION:  1823                         FACILITY:  [facility]   PHYSICIAN:  Gwen Pounds, MD       DATE OF BIRTH:  Oct 31, 1933   DATE OF ADMISSION:  11/25/2005  DATE OF DISCHARGE:                                HISTORY & PHYSICAL   GASTROENTEROLOGIST:  Wilhemina Bonito. Marina Goodell, [name]   SURGEON:  [name], [name]   CHIEF COMPLAINT:  Partial small bowel obstruction with abdominal pain.   HISTORY OF PRESENT ILLNESS:  This is a 77 year old male with hyperlipidemia,  obstructive sleep apnea, history of colon cancer, status post sigmoid  hemicolectomy, and ileocolostomy.  He is also status post radiation therapy  and chemotherapy at the end of 2000.  The patient called me today with  abdominal pain, cramping, hypotension with a blood pressure of 74/50,  abdominal distention, diaphoresis, weakness, and pelvic pressure.  He  initially had no nausea or vomiting but did have diarrhea 2-3 days prior to  his presentation.  He was sent to the ED today and was diagnosed with  partial small bowel obstruction.  I was called for admission.  He was given  IV fluids, Dilaudid, Zofran in the emergency department.  The symptoms  initially started as right lower quadrant pain since 6:00 a.m. this morning.  His symptoms got markedly worse after he ate a sandwich around lunchtime.  He vomited in the x-ray room and has actually felt better since.   PAST MEDICAL HISTORY:  1.  History of colon cancer, status post hemicolectomy.  2.  Ileocolostomy.  3.  Radiation treatment and chemotherapy in 2000.  4.  Hyperlipidemia.  5.  Obstructive sleep apnea on CPAP.  6.  Depression.   ALLERGIES:  PHENERGAN, DEMEROL.   MEDICATIONS:  1.  Lipitor 10 mg p.o. daily.  2.  CPAP q.h.s.  3.  Aspirin 81 daily.  4.  Fish oil daily.  5.  Multivitamins daily.   SOCIAL HISTORY:  He is married.  Two  children.  Two grandchildren.  No  tobacco or alcohol.   FAMILY HISTORY:  Coronary disease.  Myocardial infarction.  Stroke.   REVIEW OF SYSTEMS:  Please see history of present illness, otherwise he  denies any chest pain or shortness of breath.  He denies any urinary  difficulties.  Denies any other symptoms except what is already listed  above.   PHYSICAL EXAMINATION:  VITAL SIGNS:  Temperature 96.9, blood pressure 81-  98/61 to 122/63.  Heart rate 54.  Respiratory rate 24, satting 98% on room  air.  GENERAL:  Alert and oriented x3.  She is not in any pain or distress.  HEENT:  Oropharynx is dry.  NECK:  No JVD.  PULMONARY:  Clear to auscultation bilaterally.  CARDIAC:  Regular.  ABDOMEN:  Distended.  Surgical scar is noted.  No rebound or guarding.  Ostomy noted.  He has hyperactive bowel sounds and a large ventral hernia.  EXTREMITIES:  No clubbing or cyanosis.  There are varicose veins  noted and  minimal edema.   ANCILLARY DATA:  White count 14.9, hemoglobin 90.1, platelet count 181.  Sodium 138, potassium 4, chloride 108, bicarb 23, BUN 19, creatinine 1.3,  glucose 132.  LFTs within normal limits except for total bilirubin of 1.3.  Lipase is 25.   Acute abdominal series shows partial small bowel obstruction.  No free air.  There are some air/fluid levels.   ASSESSMENT:  This is a 77 year old male with extensive abdominal history  with surgery and radiation for colon cancer, who presents and is being  admitted for partial small bowel obstruction.   PLAN:  1.  Admit.  2.  Bowel rest.  3.  Surgical evaluation.  4.  Follow up KUB in the morning.  5.  Nausea and pain control.  6.  If all goes well, hopefully this will be only a 24-48 hour admit.  7.  Follow up on labs.  8.  Hold all medications.  9.  CPAP.  10. Possibilities of what is causing this is adhesions and scar tissue      related to the previous surgeries and radiation versus a large ventral      hernia  versus spontaneous versus the small possibility that there might      be another cancer.      Gwen Pounds, MD  Electronically Signed     JMR/MEDQ  D:  11/25/2005  T:  11/25/2005  Job:  [id number]   cc:   Wilhemina Bonito. Marina Goodell, [name] [facility]  [address]  [place]  [place] [address]   [name], [name]  [address], [address]  [place]  [place] [address]

## 2010-11-02 NOTE — Op Note (Signed)
[name], [name] NO.:  [id number]   MEDICAL RECORD NO.:  [id number]          PATIENT TYPE:  INP   LOCATION:  2899                         FACILITY:  [facility]   PHYSICIAN:  Sharlet Salina T. Hoxworth, M.D.DATE OF BIRTH:  Jun 25, 1933   DATE OF PROCEDURE:  01/21/2006  DATE OF DISCHARGE:                                 OPERATIVE REPORT   PREOPERATIVE DIAGNOSES:  1. Hartmann colostomy.  2. Ventral hernia.  3. History of small bowel obstruction.   POSTOPERATIVE DIAGNOSIS:  1. Hartmann colostomy.  2. Ventral hernia.  3. History of small bowel obstruction.  4. Parastomal hernia.   SURGICAL PROCEDURES:  1. Attempted takedown of Hartmann colostomy.  2. Repair of ventral hernia with biologic mesh.  3. Ring repair of parastomal hernia with biologic mesh.  4. Lysis of adhesions.   SURGEON:  [name], [name]   ASSISTANT:  Ovidio Kin, [name]   ANESTHESIA:  General.   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  He has had a colostomy  since that time.  I initially saw him about 2 years ago for consideration  for takedown of his colostomy at which point he declined.  He however has  had a couple of episodes of small bowel obstruction resolving spontaneously  and has a good-sized upper abdominal ventral incisional hernia that has  developed.  With these findings he represented and due to the new problems  as well which could be corrected the same time, we have elected to proceed  with takedown of his [name] colostomy, repair of his ventral hernia and  lysis of adhesions.  He has had a preoperative barium enema showing a fairly  short, but what appears to be adequate rectosigmoid pouch about 7 cm in  length.  Preoperatively the nature of the procedure, its indications, risks  of bleeding, infection were  discussed and understood.  We discussed  potential difficulties reversing his colostomy.  He is now brought to  operating room for this procedure.   DESCRIPTION OF OPERATION:  Following mechanical and antibiotic bowel prep at  home the patient brought to operating room and placed supine position  operating table and general orotracheal anesthesia was induced.  He received  broad spectrum preoperative IV antibiotics.  A pursestring silk suture was  used to occlude his stoma and the abdomen and perineum were widely sterilely  prepped and draped.  He had a long midline incision.  This entire incision  was opened as his large ventral hernia was in the upper abdomen.  Dissection  was carried down through subcutaneous tissue, the peritoneum entered.  The  abdomen was relatively free of adhesions, considering his history.  Some  omental adhesions into the hernia sac and some adhesions of omentum and  small bowel to the anterior abdominal wall were lysed.  There were a number  of inner loop adhesions of the small bowel and also some adhesions of small  bowel  down to the pelvis which were completely lysed under direct vision.  The pelvis was then the widely exposed.  There were two Prolene sutures at  the peritoneal reflection which appeared to likely mark the end of the  rectal pouch.  This was right at the peritoneal reflection with just a  smooth peritoneum up to the bladder.  Initially Dr. Ezzard Standing scoped the  patient from below with a rigid sigmoidoscope.  This could not be advanced  past what appeared to be a stricture at about 7 or 8 cm and I could not feel  the tip of the scope at the pouch.  We therefore dissected out the rectal  stump developing planes between the sump and the sacrum and between the  stump and the bladder and working laterally down along the pelvic sidewall  and it was mobilized for about 5 cm.  At this point we again tried the scope  and also a curve EEA stapler to see if  we could pass this up to the end of  the stump, but it would not advance to within several centimeters of the  end.  At this point then we opened the rectal stump from within the  abdominal cavity and it was found to be markedly thickened, stenotic and  actually quite friable.  The stenosis extended for at least several  centimeters down to probably about the level of the mid prostate.  I tried  to gently dilate this with a Tresa Endo but the tissue was fibrotic and friable  and only split rather than dilating.  I could get my finger through this  just barely and with Dr. Ezzard Standing scoping from below again, the rectum did not  appear to open up into a more normal rectum for another approximately 3 cm  beyond where we had dissected which would be down probably around 7 cm down  to the probably lower border of the prostate or below.  Examine the rectum  again from above, we felt that this dissection would carry [place] so low in this  very narrow male pelvis and with uncertain condition of the radiated rectum  below and obviously radiation damage to rectum to this point that we could  not safely accomplish an anastomosis.  I did not feel there was any way to  get a stapling device down this low in this very narrow pelvis with no real  handle of proximal colon to work with and this appeared well below where it  could be safely hand sutured.  With these concerns and with the concern  about the condition of the rectum post radiation, we felt that it was  overall safest and best to abandon attempts at the colostomy takedown and to  close the rectal stump.  Therefore the stump was closed with running 2-0  Vicryl.  The pelvis was irrigated.  Hemostasis assured.  Noted at this time  was a moderate size parastomal hernia which I really did not appreciate  preoperatively.  I felt it would be best to repair this with the patient  history of episodic small-bowel obstruction.  This was accomplished by completely  dissecting adhesions away from the edge of the abdominal wall  defect defining it which was enlarged moderately.  The defect was carried  down with one 0 Prolene suture snugging the defect down to a fingertip next  to the bowel.  An 8 x 12 cm ultra thick, MDF acellular dermis of patch was  then used, split,  wrapped around the stoma and sutured to the peritoneum and  fascia circumferentially with interrupted 0 Prolenes as a strengthening  patch around the stoma.  Following this, attention was turned to the fairly  large upper abdominal ventral incisional hernia.  The hernia sac was  completely excised.  Skin and subcutaneous flaps raised back several  centimeters in all directions from the upper end of the incision down to  about the level of the umbilicus where there was no further herniation.  Following this the midline fascia was closed with running #1 PDS beginning  at the either end of the incision and tied centrally with interrupted 0  Prolene sutures intermittently.  This was done with mild tension on the  upper fascia where the hernia had been.  Following this a 8 x 12 cm piece of  MDF acellular dermis was used as an onlay patch and sutured  circumferentially with interrupted 0-0 Prolene with nice wide coverage of  the involved fascia.  The subcutaneous tissue was irrigated.  Closed suction  drain was left subcutaneous space and skin closed with staples.  Sponge,  needle and instrument counts were correct.  Dry sterile dressings was  applied.  The pursestring suture was removed from the stoma site.  Ostomy  bag was placed.  The patient taken recovery in satisfactory condition.     [name], [name]  Electronically Signed    BTH/MEDQ  D:  01/21/2006  T:  01/21/2006  Job:  [id number]

## 2010-11-02 NOTE — Discharge Summary (Signed)
NAMEZAE, KIRTZ NO.:  [id number]   MEDICAL RECORD NO.:  [id number]          PATIENT TYPE:  INP   LOCATION:  5039                         FACILITY:  [facility]   PHYSICIAN:  Gwen Pounds, MD       DATE OF BIRTH:  [birth year]   DATE OF ADMISSION:  11/25/2005  DATE OF DISCHARGE:  11/29/2005                                 DISCHARGE SUMMARY   PRIMARY CARE Floyed Masoud:  Gwen Pounds, [name]   PRIMARY SURGEON:  Sharlet Salina T. Hoxworth, [name]   GASTROENTEROLOGIST:  Wilhemina Bonito. Marina Goodell, [name] [facility].   DISCHARGE DIAGNOSES:  1. Partial small bowel obstruction and abdominal pain.  2. History of colon cancer, status post hemicolectomy.  3. Ileocolostomy.  4. Radiation treatment and chemotherapy in 2000.  5. Hyperlipidemia.  6. Obstructive sleep apnea on CPAP.  7. Depression.   DISCHARGE MEDICATIONS:  1. Lipitor 10 mg p.o. every day.  2. Multivitamin one p.o. every day.  3. Baby aspirin 81 mg, two tablets every day.  4. Fish oil 1 tablet per day.  5. CPAP 8-cmH2O as directed.   DISCHARGE PROCEDURES:  1. Consultation with [facility].  2. Acute abdominal series on November 25, 2005, showing partial small bowel      obstruction, no acute lung disease, several recurrent abdominal x-rays.  3. CT scan of the abdomen and pelvis done on June 12, revealing dilated      small bowel consistent with partial small bowel obstruction.  CT of the      pelvis revealed dilated small bowel, appears to change caliber in the      left mid pelvis where there is a small amount of fluid and surgical      clip adjacent, this favors adhesions.  Colostomy was noted in the left      lower quadrant with no adjacent herniation but there were two midline      lower abdominal hernia, one containing a portion of the mid transverse      colon without definitive obstruction.  The more caudal of the two      hernias appears to represent an area of fat necrosis with subcutaneous      soft tissue  stranding.  4. The last KUB on November 28, 2005, showed improving partial small bowel      obstruction since prior abdominal CT.  Otherwise no significant      abnormality.   HISTORY OF PRESENT ILLNESS:  Briefly, Mr. Yann Biehn is a 77 year old  male with hyperlipidemia, obstructive sleep apnea, and history of colon  cancer.  He is status post sigmoid hemicolectomy and ileocolostomy  placement.  He is status post radiation therapy and chemotherapy at the end  of 2000.  The patient called me today with abdominal pain, cramping, and  hypotension with blood pressure in the 74/50 range with worsening abdominal  distention, diaphoresis, weakness, and pelvic pressure.  He initially had no  nausea or vomiting but did have 2-3 episodes after the phone call just prior  to the presentation.  He was sent to the ED today and was  diagnosed with a  partial small bowel obstruction.  I was called for the patient's admission.  He was given IV fluids, Dilaudid, Zofran in the emergency department.  His  main symptoms initially started in the right lower quadrant pain and started  at 6 a.m. on the date of admission.  His symptoms got markedly worse after  he had a sandwich around lunchtime.  While getting him to do the abdominal  series, he actually vomited in the x-ray room and felt better since.   PHYSICAL EXAMINATION:  VITAL SIGNS:  Revealed temperature 96.9, blood  pressure is 81-98 range, heart rate was 54, respiratory rate was 24, sating  98%.  Physical exam was unremarkable until we get to the abdominal exam.  ABDOMEN:  He was distended.  A surgical scar was noted.  No rebound or  guarding noted.  Ostomy was noted.  He had hyperactive bowel sounds and a  large ventral hernia.   His white count was 14.9 thousand and his liver tests were within normal  limits, except for an increased total bilirubin of 1.3, lipase was 25.  The  acute abdominal series showed the partial small bowel obstruction as  stated  above and there was some air/fluid levels.   HOSPITAL COURSE:  This is a 77 year old male with extensive abdominal  history with surgery, radiation for colon cancer who now presents and was  admitted on November 25, 2005, for a partial small bowel obstruction.  He was  admitted, placed on bowel rest, surgical evaluation was obtained.  KUBs were  followed.  He was placed on medications for nausea and pain control.  He was  placed on CPAP for his obstructive sleep apnea.  We discussed the  possibility of what is causing this as far as a potential repeat recurrence  of cancer but that is a low  on the differential but adhesions related to  scar tissue versus radiation or his major ventral hernia were considered to  be the strong contenders.  He was seen in consultation by general surgery  and symptomatically after the vomiting his partial small bowel obstruction  was felt to be clinically improving.  They felt that we should keep him on  NPO and hold the NG tube for now since he is feeling better and consider  doing a CT scan but follow an acute abdominal series.  The following day, we  allowed him to have some sips and chips and his white count came down to  11.1 without any use of antibiotics.  His KUB remained looking like a  partial small bowel obstruction.  He tolerated the sips and chips okay and  the following day, because he was still pretty distended, it was determined  to go ahead and check the CT scan.  The CT scan results are listed above.   Of note, his radiation oncologist is Dr. Maia Petties, telephone number [phone].  The address is [address] [place], [place].   In the past, the patient reports that Dr. Piedad Climes told him that a take down  of his ostomy was not a real possibility.  [facility] did not  understand this and felt that he may be a good candidate for a ventral  hernia repair, adhesiolysis, and possible take down and reanastomoses of  his colon to his anus.  Recent evaluation by Dr. Marina Goodell, show that there was  enough of a rectum and an anus to reanastomose him and I brought this  up  with the patient in the past and that is why he had seen Dr. Johna Sheriff as  well as in the outpatient setting.  After the patient's small bowel  obstruction is resolved, he will be following up with the general surgeon to  decide if this is a possibility to do as an outpatient.  Dr. Zachery Dakins  reviewed the x-rays and had a long discussion with the patient and the  family and felt strongly that that potential surgery could take place in the  future and again wanted Dr. Johna Sheriff to review all the data as an  outpatient.   On November 28, 2005, he started passing gas.  His abdominal exam became softer.  He was started on clear liquids.  His IV fluids were turned down.  On November 29, 2005, he had no further nausea and vomiting.  He started passing liquid  stool.  His x-ray showed improvement, so it was felt that his partial small  bowel obstruction was presumed from adhesions was clinically resolving.  He  was allowed to eat more.  He tolerated his meals and it was felt that he was  okay to be discharged to home on a low residue, heart healthy diet and to  follow up with Dr. Johna Sheriff as an outpatient.  And, he was allowed to go  home in stable condition.  All vital signs were normal and his B-MET CBC  were all back in the normal range.  The patient understood the plan,  accepted it, and is looking forward to his discussions with general surgery.   He will follow with me as needed.  He will follow with Dr. Zachery Dakins and Dr. Johna Sheriff in the next couple of  weeks.   He will present back to the hospital should his symptoms return.      Gwen Pounds, MD  Electronically Signed     JMR/MEDQ  D:  12/21/2005  T:  12/21/2005  Job:  [id number]

## 2010-11-02 NOTE — Discharge Summary (Signed)
[name], [name]             ACCOUNT NO.:  [id number]   MEDICAL RECORD NO.:  [id number]          PATIENT TYPE:  INP   LOCATION:  5001                         FACILITY:  [facility]   PHYSICIAN:  Sharlet Salina T. Hoxworth, M.D.DATE OF BIRTH:  12-27-1933   DATE OF ADMISSION:  02/07/2006  DATE OF DISCHARGE:  03/03/2006                               DISCHARGE SUMMARY   DISCHARGE DIAGNOSES:  Small bowel obstruction, secondary to small pelvic  abscess.   SURGICAL PROCEDURES:  Laparotomy, lysis of adhesions, drainage of pelvic  abscess, with a small-bowel resection with anastomosis by Dr. Sharlet Salina  T. Hoxworth on February 13, 2006.   HISTORY OF PRESENT ILLNESS:  [name] is a 77 year old male,  admitted 2-1/2 weeks post-attempted takedown of Hartman colostomy, which  was unsuccessful due to severe radiation proctitis, making it unsuitable  for anastomosis.  At that time he had a repair of an incisional and  parastomal hernia using acellular dermis.  The patient has a previous  history of small bowel obstructions.  He had complete lysis of adhesions  at the time of surgery.  He now presents with increasing pain around his  colostomy, as well as nausea, vomiting and abdominal distension.  He had  some liquid ostomy outputs.  His recent hospitalization was complicated  by a deep venous thrombosis and a pulmonary embolus.  He is now on  chronic pain with anticoagulation with Coumadin.   PAST MEDICAL/SURGICAL HISTORY:  1. Carcinoma of the rectum with LAR and en bloc right partial      colectomy with chemotherapy and radiation six years ago.  2. He did have a colostomy, a Hartman-type resection at that time.  3. Sleep apnea, for which he uses CPAP.  4. A recent pulmonary embolus, as above.  5. Elevated lipids.   MEDICATIONS:  1. Tylox p.r.n.  2. Lipitor.  3. Coumadin.   FAMILY HISTORY/SOCIAL HISTORY/REVIEW OF SYSTEMS:  See the details in the  H&P.   PHYSICAL EXAMINATION:   ABDOMEN:  Is mildly distended.  A healing  incision without infection.  No evidence of recurrent hernia.  Flat and  upright abdomen x-rays showed a small bowel obstruction.   LABORATORY DATA:  Showed a mildly elevated white count of 12,000.   HOSPITAL COURSE:  The patient was admitted and made n.p.o.  INR was 1.1.  The patient was started on IV heparin.  Flat and upright x-rays the next day showed significant improvement of  his small bowel obstruction.  Clinically he had stoma output and the  abdomen distension resolved.  He was restarted on a diet, but several  days later on August 27th, he had a recurrence of nausea and vomiting.  Flat and upright x-rays showed a recurrent small bowel obstruction.  A  CT scan of the abdomen was then obtained, which showed a high-grade  partial small bowel obstruction with adhesion at the pelvis and the  rectal stump.  He continued to have evidence of a small bowel  obstruction radiographically and clinically.  He was made n.p.o. again,  and exploration was recommended.  He was taken back to  the operating  room on August 30th.  He was found to have a small pelvic abscess of  just 1 centimeter or 2 at the rectal stump, but this had caused the  severe adhesion to the small bowel with obstruction.  He underwent  resection of this portion of the small bowel with anastomosis and lysis  of adhesions.  He was maintained on IV heparin up until the surgery and  a vena cava filter was placed by CVTS preoperatively, as anticoagulation  had to be stopped.  Postoperatively he was started on TNA for  nutritional support.  He was somewhat hypertensive and hypo-volemic the  first postoperative day or two, which responded to IV fluids.  He was  observed in the ICU and then moved to the floor on the third postop day.  The NG tube remained in place.  At this point he gradually improved.  He  had some confusion, but this cleared.  He was maintained on IV  antibiotics,  although the abscess did not grow bacteria.  Antibiotics  were discontinued after a one-week course.  He remained on TNA.  He  began to have ostomy output and the NG tube was removed on the seventh  postoperative day.  He has been on a clear liquid diet.  He had been  restarted on heparin and was then transitioned to Coumadin.  The stoma  began to function and his diet was advanced gradually.  His INR was  therapeutic.  The heparin was discontinued.  He continued to gradually  improve, and was discharged home on March 03, 2006.   DISCHARGE MEDICATIONS:  Are the same as on admission.   FOLLOWUP:  Is to be in my office in one week.      [name], [name]  Electronically Signed     BTH/MEDQ  D:  04/30/2006  T:  04/30/2006  Job:  [id number]

## 2010-11-02 NOTE — Op Note (Signed)
[name], [name] NO.:  [id number]   MEDICAL RECORD NO.:  [id number]          PATIENT TYPE:  INP   LOCATION:  2102                         FACILITY:  [facility]   PHYSICIAN:  Quita Skye. Hart Rochester, [name]  DATE OF BIRTH:  July 11, 1933   DATE OF PROCEDURE:  DATE OF DISCHARGE:                                 OPERATIVE REPORT   PREOPERATIVE DIAGNOSIS:  Recent pulmonary embolus with contraindication to  heparin therapy.   PROCEDURE:  1. Inferior vena cavogram via right common femoral vein approach.  2. Insertion of an Optease IVC filter (retrievable).   SURGEON:  Quita Skye. Hart Rochester, [name]   ANESTHESIA:  Local Xylocaine, contrast 122 mL.   COMPLICATIONS:  None.   DESCRIPTION OF PROCEDURE:  The patient was taken to [facility] peripheral  endovascular placed in supine position at which time right groin was prepped  Betadine solution, draped in a routine sterile manner.  After infiltration  of 1% Xylocaine, the right common femoral vein was entered percutaneously.  Guidewire passed into the suprarenal vena cava under fluoroscopic guidance.  A long sheath and dilator were passed over the guidewire to the proximal  right common iliac vein level.  The guidewire was removed and inferior vena  cavogram was performed to look for the origin of the renal veins.  Two  attempts were made through the sheath but there was no reflux into the renal  veins.  Therefore pigtail catheter was positioned in the distal inferior  vena cava and third vena cavogram was performed injecting 40 mL of contrast  30 mL per second.  This revealed a level the renal veins to be just distal  to the L1 interspace.  The pigtail catheter was then removed over the guide  wire in the Optease inferior vena caval filter was then deployed at the L2-  L3 interspace well distal to the origin of the renal veins.  This was done  without difficulty.  The sheath was then removed, adequate compression  applied.  No  complications ensued.   FINDINGS:  1. Widely patent inferior vena cava with level of bilateral renal veins      distal to L1 interspace.  2. Successful deployment of Optease IVC filter at the L2-L3 interspace.           ______________________________  Quita Skye Hart Rochester, [name]     JDL/MEDQ  D:  02/13/2006  T:  02/14/2006  Job:  [id number]

## 2010-11-02 NOTE — Op Note (Signed)
NAMEKRISTAIN, [name] NO.:  [id number]   MEDICAL RECORD NO.:  [id number]          PATIENT TYPE:  INP   LOCATION:  2102                         FACILITY:  [facility]   PHYSICIAN:  [name], M.D.DATE OF BIRTH:  September 03, 1933   DATE OF PROCEDURE:  02/13/2006  DATE OF DISCHARGE:                                 OPERATIVE REPORT   PREOPERATIVE DIAGNOSIS:  Small bowel obstruction and pelvic abscess.   POSTOPERATIVE DIAGNOSIS:  Small bowel obstruction and pelvic abscess.   SURGICAL PROCEDURE:  1. Laparotomy with lysis of adhesions.  2. Drainage of pelvic abscess.  3. Small-bowel resection with anastomosis and placement of central line.   SURGEON:  Dr. Johna [name]   ASSISTANT:  Dr. Lorelee [name]   ANESTHESIA:  General.   BRIEF HISTORY:  [name] is a 77 year old male, who is approximately  2-1/2 weeks following laparotomy and attempted takedown of a Hartmann  colostomy.  However, at that time, he was found to have significant  radiation damage to his rectosigmoid, and anastomosis was not possible.  He  did undergo a repair of a ventral incisional hernia using acellular dermis  and also repair of a parastomal hernia using acellular dermis.  Postoperatively, the patient's course was complicated by pulmonary embolus  requiring anticoagulation.  He then, just after discharge, was readmitted  with a small bowel obstruction that has been persistent despite conservative  management.  CT scan has shown a transition point in the pelvis with a small  amount of fluid and air adjacent to the rectal stump consistent with a small  abscess.  Due to failure to progress with conservative management with bowel  rest and NG suction over several days and persistent small bowel  obstruction, I have recommended proceeding with laparotomy.  The nature of  the procedure, indications, risks of bleeding and infection were discussed  and understood.  The patient has undergone  preoperative placement of a vena  cava filter due to recent PE and needing to be off anticoagulation for  surgery.  I have also recommended proceeding with central line placement for  postoperative TNA.   DESCRIPTION OF OPERATION:  The patient brought to the operating room, placed  in supine position on the operating table, and general orotracheal  anesthesia was induced.  The abdomen was widely sterilely prepped and draped  with Ioban and the stoma excluded.  Foley catheter was placed.  NG tube was  already placed.  He received broad spectrum preoperative IV antibiotics.  Correct patient and procedure were verified.  The previous midline incision  was used along about its lower two-thirds and dissection carried down  through subcutaneous tissue down to the fascia which was divided with  cautery.  On the upper portion of the incision, I did go ahead and divide  down through the acellular dermis, probably the lower half of it that had  been placed on his upper abdominal incisional hernia.  It seemed well  incorporated at this point to the fascia and was left in place.  Dissection  was carried down to the peritoneum and with careful dissection, the  peritoneum was entered in 1 area, and then very dense adhesions of small  bowel were taken down from the underside of the incision with careful sharp  dissection.  There were fairly marked adhesions that would be expected at  this time postoperatively.  However, as the dissection progressed away from  the wound, these became somewhat more filmy, and we were able to clear the  entire anterior abdominal wall in all directions.  There were some dense  adhesions of the small bowel up around the area of the colostomy but no  recurrent hernia, and these were all taken down.  The ligament of Treitz was  identified, and the proximal bowel was quite dilated.  The bowel was then  traced distally, dividing multiple interloop adhesions.  The bowel remained   dilated down to the ileum which was found to be densely adherent down to the  area of the sacrum and rectal stump.  There was a very hard area of  inflammation and a very densely adherent loop of bowel to this area.  This  was carefully sharply dissected up and a small abscess cavity entered with  pus.  This was not foul smelling.  It was cultured.  This cavity was just  adjacent to and posterior to the rectal stump that had been opened at the  time of attempted takedown of his [facility] colostomy.  The stump closure  appeared intact, and I did not see any communication into the rectal stump.  This area was thoroughly irrigated.  The small bowel was then able to be  brought completely up out of the pelvis.  At this point, the area of bowel  that had been stuck down to the abscess and was essentially a portion of the  wall of the abscess was carefully inspected, and there was a small pinhole  in this bowel but also more significantly marked scarring and narrowing of  the bowel secondary to this inflammatory process over about a 5-6 inch area,  and we did not feel that this could simply be repaired and be reliably  patent.  We elected to proceed with resection as the bowel just proximal and  distal to this appeared relatively healthy.  The mesentery of the short  involved segment was then sequentially divided between clamps and tied with  2-0 silk ties.  Following this, a functional end-to-end anastomosis was  created with the GIA stapler, and the common enterotomy was closed and the  specimen removed with a single firing of the TA-60 stapler.  The anastomosis  was tested under pressure without leak.  The crotch of the staple line was  reinforced with the 2-0 silk suture.  The mesentery was closed with  interrupted 2-0 silks.  The abdomen was then thoroughly irrigated and  complete hemostasis assured.  We were concerned that the small bowel would again become adherent to the pelvis with the raw  surface and drained abscess  cavity, and a piece of SurgiWrap barrier was trimmed to cover the floor of  the pelvis and was tacked in place with interrupted 3-0 Vicryl and then the  small bowel replaced into the pelvis over this to try to prevent adhesions.  The small bowel was again carefully run from the patient's previous  ileocolonic anastomosis on the right back up to the ligament of Treitz, and  all was completely free and uninjured.  Following this, another piece of  SurgiWrap was placed underneath the incision in an attempt to prevent  adhesions, and the wound was closed with running #1 PDS begun at either end  of the incision and tied centrally, again incorporating the previously  placed acellular dermis and upper part of the incision which appeared nicely  incorporated into the fascia at this point.  The subcutaneous tissue was  irrigated, and the skin was closed with staples.  Following this, the  patient's left neck and shoulder were sterilely prepped and draped and the  left subclavian vein easily cannulated with needle and guidewire and s  double lumen central catheter placed over the guidewire.  This flushed and  aspirated easily and was flushed with saline and sutured in place.  Sponge,  needle, and instrument counts were correct.  The patient then taken to  recovery in satisfactory condition.      [name], [name]  Electronically Signed     BTH/MEDQ  D:  02/13/2006  T:  02/14/2006  Job:  [id number]

## 2010-11-02 NOTE — Discharge Summary (Signed)
[name], [name]             ACCOUNT NO.:  [id number]   MEDICAL RECORD NO.:  [id number]          PATIENT TYPE:  INP   LOCATION:  3740                         FACILITY:  [facility]   PHYSICIAN:  Sharlet Salina T. Hoxworth, M.D.DATE OF BIRTH:  [birth year]   DATE OF ADMISSION:  01/21/2006  DATE OF DISCHARGE:  02/01/2006                               DISCHARGE SUMMARY   DISCHARGE DIAGNOSES:  1. Status post Hartmann colectomy for cancer of the colon.  2. Ventral incisional hernia.  3. Postoperative pulmonary embolus.   OPERATIONS AND PROCEDURES:  Exploratory laparotomy with attempt at  takedown of Hartmann colostomy and repair of parastomal and ventral  incisional hernias with biologic mesh on January 21, 2006.   CONSULTATIONS:  With Dr. Emilia Beck.   HISTORY OF PRESENT ILLNESS:  Mr. Weldon Nouri is a 77 year old male  with a history of T4N1 carcinoma of the sigmoid colon operated on in  2000 in [place].  This required a Hartmann resection as well as an ilio-  cecectomy to direct invasion of the cecum by the tumor.  He then had  postoperative radiation therapy.  He was initially referred to me  earlier this year for consideration for colostomy takedown after seeing  Dr. Marina Goodell for routine follow-up.  He states that takedown was briefly  discussed with him years ago in [place], but was not undertaken for  reasons at this point are not completely clear.  The patient also has  known ventral and parastomal hernia, and has had at least one episode of  recent small-bowel obstruction, confirmed by CT.  He has had a  preoperative workup including barium enema through the stoma and through  his rectum which shows a somewhat narrowed, as expected from disease,  but otherwise normal appearing rectal pouch.  After extensive discussion  in the office, we have elected to proceed with takedown of his Gertie Gowda  colostomy and repair of his ventral and parastomal hernias.  Following  mechanical  antibiotic bowel prep at home, he is admitted for the  procedure.   PAST MEDICAL HISTORY:  Significant mainly only as above.  He has had  sleep apnea for which he uses the CPAP and mildly elevated cholesterol.   MEDICATIONS:  1. Lipitor.  2. Baby aspirin.   ALLERGIES:  DEMEROL AND PHENERGAN COMBINATION CAUSE CAUSED A DYSPHORIC  REACTION.   SOCIAL HISTORY/FAMILY HISTORY/ REVIEW OF SYSTEMS:  Noncontributory.  See  admission H&P.   PHYSICAL EXAMINATION:  VITAL SIGNS:  Within normal limits.  GENERAL:  He is a mildly overweight, white male in no acute distress.  ABDOMEN:  Pertinent findings are limited to the abdomen.  He has a  healthy appearing colostomy to the left lower quadrant.  Moderate  ventral incisional and parastomal hernias are noted.  RECTAL:  Some tenderness, but no other abnormalities.   HOSPITAL COURSE:  The patient was admitted on the morning of his  procedure.  At the time of exploration, we were able to dissect his  rectal pouch which was quite low, but after dissecting this free the  wall was markedly thickened with a very narrow lumen  secondary to  apparent radiation enteritis.  This really was not completely evident on  his preoperative barium enema, but after a long time of working with  this and dissecting around the rectal stump, we could not find adequate  bowel for anastomosis and felt it would be highly unlikely to succeed  should we attempt anastomosis to this abnormal bowel, and takedown of  his Gertie Gowda colostomy was unfortunately abandoned.  He did not then  undergo lysis of adhesions, repair of his ventral and parastomal  hernias, using MTF acellular dermis.  His initial postoperative course  was benign.  He was observed overnight in the intensive care unit due to  his sleep apnea and then transferred to the floor on the first  postoperative day, and clear liquid diet was started.  Diet was  advanced.  He was transferred oral pain medications and  appeared to be  doing well initially.  However he did develop some increased abdominal  distension and nausea noted on the third and fourth postoperative days.  He was not having ostomy output, and he was observed on a liquid diet.  On the fifth postoperative day, however, the patient developed acute  shortness of breath and spiral CT scan showed large bilateral pulmonary  emboli.  He was hypoxemic and was transferred to the intensive care unit  for observation and started on IV heparin.  Dr. Kennedy Bucker from Internal  [facility]icine followed the patient closely.  He remained hemodynamically  stable and his oxygenation gradually improved.  He was started on p.o.  Coumadin on August 14.  At this point, he was having colostomy output,  and his diet was advanced.  He is transferred back to the floor on  August 15 with no shortness of breath, chest pain and normal O2 sat's.  He was advanced to a regular diet which he appeared to tolerate well  initially, and discharge was anticipated.  He continued to improve and  was discharged home on February 01, 2006.   DISCHARGE MEDICATIONS:  The same as admission plus Coumadin.   FOLLOWUP:  Follow-up is to be for his anticoagulation by Dr. Kennedy Bucker.   DISCHARGE INSTRUCTIONS:  He is on a regular diet.   DISCHARGE MEDICATIONS:  1. Tylox for pain.  2. Coumadin dosing per Dr. Kennedy Bucker.  3. Admission medications.   PLAN:  Staples removed to wound healing primarily.      [name], [name]  Electronically Signed     BTH/MEDQ  D:  04/07/2006  T:  04/08/2006  Job:  [id number]

## 2010-11-02 NOTE — Consult Note (Signed)
[name], [name] NO.:  [id number]   MEDICAL RECORD NO.:  [id number]          PATIENT TYPE:  INP   LOCATION:  3302                         FACILITY:  [facility]   PHYSICIAN:  Kari Baars, [name]  DATE OF BIRTH:  1933/10/15   DATE OF CONSULTATION:  DATE OF DISCHARGE:                                   CONSULTATION   PRIMARY CARE PHYSICIAN:  Creola Corn, MD.   REASON FOR CONSULTATION:  Hypoxemia after attempted colostomy takedown and  adhesiolysis (postop day #5).   HISTORY OF PRESENT ILLNESS:  [name] is a 77 year old white male with a  history of colon cancer status post hemicolectomy/radiation  therapy/chemotherapy in 2000, recent hospitalization for recent small bowel  obstruction and obstructive sleep apnea on home CPAP who was admitted on  08/07, for an elective attempted colostomy takedown and adhesiolysis.  Unfortunately they were unable to complete this procedure due to a friable  thickened stump which was felt not amenable to anastomosis.  Postoperatively, the patient has had persistent and recurrent nausea and  vomiting which occurs most often after he eats.  He has had a slight  increase in shortness of breath over the past 4 days.  Today he developed  significant increase in shortness of breath after walking from the restroom  to his bed and was noted to have oxygen saturation which was 73%.  He  received Xopenex without significant improvement.  He is on no prior history  of cardiopulmonary disease.  He had indigestion two nights ago,  but no  chest pain.   REVIEW OF SYSTEMS:  Flatus.  Minimal abdominal pain requiring morphine PCA  about 7 mg over the past 7 hours.  All other systems negative.  Specifically  denies pleuritic chest pain or lower extremity swelling.   PAST MEDICAL HISTORY:  1. Colon cancer status post partial sigmoid colectomy (2000), radiation      therapy, chemotherapy.  2. Recent small bowel obstruction (11/2005).  3.  Obstructive sleep apnea on CPAP (8 cm water).  4. Hyperlipidemia.   MEDICATIONS AT HOME:  1. Lipitor 10 mg daily.  2. Multivitamin.  3. Aspirin 81 mg daily.  4. Fish oil.   CURRENT MEDICATIONS:  1. Protonix 40 mg daily.  2. Reglan 10 mg q.6 hours.  3. Morphine PCA.  4. He is on SCD's' but is not on Lovenox or heparin.   ALLERGIES:  1. CODEINE.  2. PHENERGAN.  3. DEMEROL.  4. TETANUS.   SOCIAL HISTORY:  He is married with two children.  His wife and two children  are present.  He has 2 grandchildren.  No history of tobacco use.   FAMILY HISTORY:  Significant for coronary artery disease.   PHYSICAL EXAM:  VITAL SIGNS:  Temperature 98.3, heart rate in the 60s and  70s yesterday, up to 120 today.  Respiratory rate 24, blood pressure 143-  167/72-86.  Oxygen saturation 95% on 2 liters earlier today down to 79%.  Currently 88% on 5 liters.  In's and out's 1928 recorded with 60 cc of urine  output recorded yesterday, probably not accurate.  Negative over the past  several days.  GENERAL:  He is dyspneic at rest.  HEENT:  No scleral icterus.  Oropharynx moist.  NECK:  Supple without lymphadenopathy.  HEART:  Tachycardiac without murmurs, rubs or gallops.  LUNGS:  Minimal bibasilar crackles.  No rhonchi.  ABDOMEN:  Distended, nontender with no hypoactive bowel sounds.  He has a  right upper quadrant and JP drain and left-sided ostomy with gas in the bag.  Incision is clean, dry, intact with staples.  EXTREMITIES:  No clubbing, cyanosis or edema.   LABS FROM YESTERDAY:  White count 11.3, hemoglobin 14.6, platelets 232.  BMET significant for sodium 144, potassium 3.9, chloride 108, bicarb 30, BUN  13, creatinine 0.9, glucose 146.  Abdominal x-ray from today shows a  possible small bowel obstruction versus ileus.   ASSESSMENT/PLAN:  1. Postop hypoxia - the patient is at high risk for PE given his      inability.  Less likely aspiration pneumonia in the setting of his      nausea  and vomiting, or pulmonary edema.  He has been net more out more      than in, making this less likely.  I would strongly agree with CT to      evaluate for PE with treatment following.  2. Postop nausea and vomiting secondary to ileus versus recurrent small      bowel obstruction - continue management per general surgery.  3. Tachycardia - likely due to his pulmonary process.  Will obtain an EKG.   Thank you for involving [place] in the care of this patient.  We will follow  closely with further management pending his CT results.      Kari Baars, [name]  Electronically Signed     WS/MEDQ  D:  01/26/2006  T:  01/27/2006  Job:  [id number]   cc:   Gwen Pounds, MD  [place] Daphine Deutscher, MD

## 2010-11-02 NOTE — Consult Note (Signed)
NAMELILTON, PARE NO.:  [id number]   MEDICAL RECORD NO.:  [id number]          PATIENT TYPE:  INP   LOCATION:  5039                         FACILITY:  [facility]   PHYSICIAN:  Maisie Fus A. Cornett, M.D.DATE OF BIRTH:  [birth year]   DATE OF CONSULTATION:  11/25/2005  DATE OF DISCHARGE:                                   CONSULTATION   REFERRING PHYSICIAN:  Gwen Pounds, MD   REASON FOR CONSULTATION:  Abdominal pain.   HISTORY OF PRESENT ILLNESS:  The patient is a 77 year old male with a 1-day  history of progressive abdominal pain, nausea and vomiting.  He said he felt  well until this morning.  When he got up, he had more right lower quadrant  abdominal pain that has progressed to more diffuse pain.  He became quite  miserable and saw his doctor and has been to the hospital.  He had one large  episode of emesis and this made him feel much better.  Now he is pain free  and has no significant complaints.  The pain did not radiate, but it was  severe on a scale of 10/10, he states.  Again, it started in his right lower  quadrant and did not radiate.  He has a history of what sounds like low  anterior resection with an end-sigmoid colostomy.  He has been seen by Dr.  Johna Sheriff for takedown of his ostomy in the past, but this has been some time  ago.  He has a history of chemotherapy and radiation therapy, but this was  all done in [place] and I have no records of this.   PAST MEDICAL HISTORY:  1.  History of rectal cancer.  2.  History of radiation therapy.  3.  History of chemotherapy.  4.  Hyperlipidemia.  5.  Depression.   PAST SURGICAL HISTORY:  Please see above.  The patient does have what  appears to be an end-sigmoid colostomy.   MEDICATIONS:  Lipitor, CPAP, fish oil, aspirin and multivitamin.   ALLERGIES:  PHENERGAN AND DEMEROL.   SOCIAL HISTORY:  He is married with two children.   REVIEW OF SYSTEMS:  As stated above, otherwise 10-point review of  systems  negative.   PHYSICAL EXAMINATION:  VITAL SIGNS:  Temperature 96, heart rate 60, blood  pressure 130/60.  GENERAL:  Pleasant male in no apparent distress.  HEENT:  Extraocular movements intact.  No scleral icterus.  Oropharynx  clear.  NECK:  Supple.  Nontender, no mass.  CHEST:  Clear to auscultation.  Chest wall motion is normal.  CARDIAC:  Regular rate and rhythm with good peripheral circulation.  ABDOMEN:  Soft with epigastric reducible ventral hernia noted which is  nontender.  Belly overall was nontender without mass or lesion.  There is no  rebound or guarding.  Surgical scars are noted from previous surgery.  EXTREMITIES:  Muscle tone is normal.  Range of motion is normal.   LABORATORY DATA AND X-RAY FINDINGS:  Acute abdominal series reveals small  bowel and stool in the colon consistent with a partial small bowel  obstruction.  White count 14,000 with a left shift, hemoglobin elevated at 15.  Electrolytes are within normal limits with a BUN of 19, creatinine 1.3.  Lipase normal with glucose 128.   IMPRESSION:  Partial small bowel obstruction.   RECOMMENDATIONS:  At this point in time, he is feeling fairly well.  I will  continue n.p.o. overnight.  Will repeat the acute abdominal series in the  morning and decide if a CT scan is appropriate in this gentleman.  He may  need further work up for this in the future, but currently seems to be doing  better and would do nothing else except what is being done currently.   Thank you for this consultation.      Thomas A. Cornett, [name]  Electronically Signed     TAC/MEDQ  D:  11/25/2005  T:  11/26/2005  Job:  [id number]

## 2010-12-20 ENCOUNTER — Other Ambulatory Visit: Payer: Self-pay | Admitting: Hematology & Oncology

## 2010-12-20 ENCOUNTER — Encounter: Payer: Medicare Other | Admitting: Hematology & Oncology

## 2010-12-20 DIAGNOSIS — C187 Malignant neoplasm of sigmoid colon: Secondary | ICD-10-CM

## 2010-12-20 LAB — CBC WITH DIFFERENTIAL (CANCER CENTER ONLY)
BASO#: 0 10*3/uL (ref 0.0–0.2)
EOS%: 2.2 % (ref 0.0–7.0)
LYMPH%: 26.9 % (ref 14.0–48.0)
MCH: 32.2 pg (ref 28.0–33.4)
MCHC: 35 g/dL (ref 32.0–35.9)
MCV: 92 fL (ref 82–98)
MONO%: 11.2 % (ref 0.0–13.0)
NEUT#: 3 10*3/uL (ref 1.5–6.5)
Platelets: 128 10*3/uL — ABNORMAL LOW (ref 145–400)

## 2010-12-20 LAB — COMPREHENSIVE METABOLIC PANEL
ALT: 15 U/L (ref 0–53)
AST: 19 U/L (ref 0–37)
Albumin: 4.5 g/dL (ref 3.5–5.2)
Alkaline Phosphatase: 61 U/L (ref 39–117)
Potassium: 4.2 mEq/L (ref 3.5–5.3)
Sodium: 136 mEq/L (ref 135–145)
Total Protein: 6.8 g/dL (ref 6.0–8.3)

## 2010-12-20 LAB — CEA: CEA: 0.6 ng/mL (ref 0.0–5.0)

## 2011-07-04 DIAGNOSIS — Z7901 Long term (current) use of anticoagulants: Secondary | ICD-10-CM | POA: Diagnosis not present

## 2011-07-04 DIAGNOSIS — I2699 Other pulmonary embolism without acute cor pulmonale: Secondary | ICD-10-CM | POA: Diagnosis not present

## 2011-08-08 DIAGNOSIS — Z7901 Long term (current) use of anticoagulants: Secondary | ICD-10-CM | POA: Diagnosis not present

## 2011-08-08 DIAGNOSIS — I2699 Other pulmonary embolism without acute cor pulmonale: Secondary | ICD-10-CM | POA: Diagnosis not present

## 2011-08-28 DIAGNOSIS — E785 Hyperlipidemia, unspecified: Secondary | ICD-10-CM | POA: Diagnosis not present

## 2011-08-28 DIAGNOSIS — I10 Essential (primary) hypertension: Secondary | ICD-10-CM | POA: Diagnosis not present

## 2011-08-28 DIAGNOSIS — Z7901 Long term (current) use of anticoagulants: Secondary | ICD-10-CM | POA: Diagnosis not present

## 2011-08-28 DIAGNOSIS — I2699 Other pulmonary embolism without acute cor pulmonale: Secondary | ICD-10-CM | POA: Diagnosis not present

## 2011-09-15 DIAGNOSIS — I10 Essential (primary) hypertension: Secondary | ICD-10-CM | POA: Diagnosis not present

## 2011-09-15 DIAGNOSIS — Z7901 Long term (current) use of anticoagulants: Secondary | ICD-10-CM | POA: Diagnosis not present

## 2011-09-15 DIAGNOSIS — Z86718 Personal history of other venous thrombosis and embolism: Secondary | ICD-10-CM | POA: Diagnosis not present

## 2011-09-15 DIAGNOSIS — Z933 Colostomy status: Secondary | ICD-10-CM | POA: Diagnosis not present

## 2011-09-15 DIAGNOSIS — L0211 Cutaneous abscess of neck: Secondary | ICD-10-CM | POA: Diagnosis not present

## 2011-09-15 DIAGNOSIS — Z86711 Personal history of pulmonary embolism: Secondary | ICD-10-CM | POA: Diagnosis not present

## 2011-09-15 DIAGNOSIS — E785 Hyperlipidemia, unspecified: Secondary | ICD-10-CM | POA: Diagnosis not present

## 2011-09-15 DIAGNOSIS — Z85038 Personal history of other malignant neoplasm of large intestine: Secondary | ICD-10-CM | POA: Diagnosis not present

## 2011-09-15 DIAGNOSIS — K047 Periapical abscess without sinus: Secondary | ICD-10-CM | POA: Diagnosis not present

## 2011-09-15 DIAGNOSIS — Z79899 Other long term (current) drug therapy: Secondary | ICD-10-CM | POA: Diagnosis not present

## 2011-09-19 DIAGNOSIS — I2699 Other pulmonary embolism without acute cor pulmonale: Secondary | ICD-10-CM | POA: Diagnosis not present

## 2011-09-19 DIAGNOSIS — Z7901 Long term (current) use of anticoagulants: Secondary | ICD-10-CM | POA: Diagnosis not present

## 2011-09-30 ENCOUNTER — Other Ambulatory Visit: Payer: Self-pay | Admitting: Internal Medicine

## 2011-09-30 DIAGNOSIS — E041 Nontoxic single thyroid nodule: Secondary | ICD-10-CM

## 2011-10-02 ENCOUNTER — Other Ambulatory Visit: Payer: Medicare Other

## 2011-10-03 ENCOUNTER — Ambulatory Visit
Admission: RE | Admit: 2011-10-03 | Discharge: 2011-10-03 | Disposition: A | Discharge status: Home / Self Care | Payer: Medicare Other | Source: Ambulatory Visit | Attending: Internal Medicine | Admitting: Internal Medicine

## 2011-10-03 DIAGNOSIS — E042 Nontoxic multinodular goiter: Secondary | ICD-10-CM | POA: Diagnosis not present

## 2011-10-03 DIAGNOSIS — E041 Nontoxic single thyroid nodule: Secondary | ICD-10-CM

## 2011-10-08 ENCOUNTER — Other Ambulatory Visit: Payer: Self-pay | Admitting: Internal Medicine

## 2011-10-08 DIAGNOSIS — E041 Nontoxic single thyroid nodule: Secondary | ICD-10-CM

## 2011-10-08 DIAGNOSIS — I2699 Other pulmonary embolism without acute cor pulmonale: Secondary | ICD-10-CM | POA: Diagnosis not present

## 2011-10-08 DIAGNOSIS — Z7901 Long term (current) use of anticoagulants: Secondary | ICD-10-CM | POA: Diagnosis not present

## 2011-10-15 ENCOUNTER — Ambulatory Visit
Admission: RE | Admit: 2011-10-15 | Discharge: 2011-10-15 | Disposition: A | Discharge status: Home / Self Care | Payer: Medicare Other | Source: Ambulatory Visit | Attending: Internal Medicine | Admitting: Internal Medicine

## 2011-10-15 ENCOUNTER — Other Ambulatory Visit (HOSPITAL_COMMUNITY)
Admission: RE | Admit: 2011-10-15 | Discharge: 2011-10-15 | Disposition: A | Discharge status: Home / Self Care | Payer: Medicare Other | Source: Ambulatory Visit | Attending: Diagnostic Radiology | Admitting: Diagnostic Radiology

## 2011-10-15 DIAGNOSIS — I2699 Other pulmonary embolism without acute cor pulmonale: Secondary | ICD-10-CM | POA: Diagnosis not present

## 2011-10-15 DIAGNOSIS — E041 Nontoxic single thyroid nodule: Secondary | ICD-10-CM

## 2011-10-15 DIAGNOSIS — E049 Nontoxic goiter, unspecified: Secondary | ICD-10-CM | POA: Diagnosis not present

## 2011-10-15 DIAGNOSIS — Z7901 Long term (current) use of anticoagulants: Secondary | ICD-10-CM | POA: Diagnosis not present

## 2011-10-15 DIAGNOSIS — E042 Nontoxic multinodular goiter: Secondary | ICD-10-CM | POA: Diagnosis not present

## 2011-10-17 DIAGNOSIS — I2699 Other pulmonary embolism without acute cor pulmonale: Secondary | ICD-10-CM | POA: Diagnosis not present

## 2011-10-17 DIAGNOSIS — Z7901 Long term (current) use of anticoagulants: Secondary | ICD-10-CM | POA: Diagnosis not present

## 2011-10-22 DIAGNOSIS — Z7901 Long term (current) use of anticoagulants: Secondary | ICD-10-CM | POA: Diagnosis not present

## 2011-10-22 DIAGNOSIS — I2699 Other pulmonary embolism without acute cor pulmonale: Secondary | ICD-10-CM | POA: Diagnosis not present

## 2011-10-24 DIAGNOSIS — I2699 Other pulmonary embolism without acute cor pulmonale: Secondary | ICD-10-CM | POA: Diagnosis not present

## 2011-10-24 DIAGNOSIS — Z7901 Long term (current) use of anticoagulants: Secondary | ICD-10-CM | POA: Diagnosis not present

## 2011-11-06 DIAGNOSIS — L821 Other seborrheic keratosis: Secondary | ICD-10-CM | POA: Diagnosis not present

## 2011-11-06 DIAGNOSIS — I2699 Other pulmonary embolism without acute cor pulmonale: Secondary | ICD-10-CM | POA: Diagnosis not present

## 2011-11-06 DIAGNOSIS — Z7901 Long term (current) use of anticoagulants: Secondary | ICD-10-CM | POA: Diagnosis not present

## 2011-11-06 DIAGNOSIS — D239 Other benign neoplasm of skin, unspecified: Secondary | ICD-10-CM | POA: Diagnosis not present

## 2011-11-06 DIAGNOSIS — L570 Actinic keratosis: Secondary | ICD-10-CM | POA: Diagnosis not present

## 2011-11-25 ENCOUNTER — Other Ambulatory Visit: Payer: Self-pay | Admitting: Internal Medicine

## 2011-11-25 DIAGNOSIS — I861 Scrotal varices: Secondary | ICD-10-CM

## 2011-11-25 DIAGNOSIS — N433 Hydrocele, unspecified: Secondary | ICD-10-CM | POA: Diagnosis not present

## 2011-11-26 ENCOUNTER — Ambulatory Visit
Admission: RE | Admit: 2011-11-26 | Discharge: 2011-11-26 | Disposition: A | Discharge status: Home / Self Care | Payer: Medicare Other | Source: Ambulatory Visit | Attending: Internal Medicine | Admitting: Internal Medicine

## 2011-11-26 DIAGNOSIS — I861 Scrotal varices: Secondary | ICD-10-CM | POA: Diagnosis not present

## 2011-12-11 DIAGNOSIS — Z7901 Long term (current) use of anticoagulants: Secondary | ICD-10-CM | POA: Diagnosis not present

## 2011-12-11 DIAGNOSIS — I2699 Other pulmonary embolism without acute cor pulmonale: Secondary | ICD-10-CM | POA: Diagnosis not present

## 2011-12-26 ENCOUNTER — Other Ambulatory Visit (HOSPITAL_BASED_OUTPATIENT_CLINIC_OR_DEPARTMENT_OTHER): Payer: Medicare Other | Admitting: Lab

## 2011-12-26 ENCOUNTER — Ambulatory Visit (HOSPITAL_BASED_OUTPATIENT_CLINIC_OR_DEPARTMENT_OTHER): Payer: Medicare Other | Admitting: Hematology & Oncology

## 2011-12-26 VITALS — BP 126/63 | HR 51 | Temp 97.5°F | Ht 62.0 in | Wt 184.0 lb

## 2011-12-26 DIAGNOSIS — C189 Malignant neoplasm of colon, unspecified: Secondary | ICD-10-CM

## 2011-12-26 DIAGNOSIS — C187 Malignant neoplasm of sigmoid colon: Secondary | ICD-10-CM | POA: Diagnosis not present

## 2011-12-26 DIAGNOSIS — I82409 Acute embolism and thrombosis of unspecified deep veins of unspecified lower extremity: Secondary | ICD-10-CM | POA: Diagnosis not present

## 2011-12-26 DIAGNOSIS — Z85038 Personal history of other malignant neoplasm of large intestine: Secondary | ICD-10-CM

## 2011-12-26 DIAGNOSIS — E559 Vitamin D deficiency, unspecified: Secondary | ICD-10-CM | POA: Diagnosis not present

## 2011-12-26 LAB — CBC WITH DIFFERENTIAL (CANCER CENTER ONLY)
BASO#: 0 10*3/uL (ref 0.0–0.2)
Eosinophils Absolute: 0.2 10*3/uL (ref 0.0–0.5)
HCT: 39.6 % (ref 38.7–49.9)
HGB: 13.5 g/dL (ref 13.0–17.1)
LYMPH#: 1.3 10*3/uL (ref 0.9–3.3)
MCH: 31.9 pg (ref 28.0–33.4)
NEUT#: 2.9 10*3/uL (ref 1.5–6.5)
RBC: 4.23 10*6/uL (ref 4.20–5.70)

## 2011-12-26 NOTE — Progress Notes (Signed)
This office note has been dictated.

## 2011-12-27 LAB — COMPREHENSIVE METABOLIC PANEL
Albumin: 4.2 g/dL (ref 3.5–5.2)
BUN: 19 mg/dL (ref 6–23)
CO2: 25 mEq/L (ref 19–32)
Calcium: 9.1 mg/dL (ref 8.4–10.5)
Chloride: 104 mEq/L (ref 96–112)
Glucose, Bld: 80 mg/dL (ref 70–99)
Potassium: 4.2 mEq/L (ref 3.5–5.3)

## 2011-12-27 LAB — VITAMIN D 25 HYDROXY (VIT D DEFICIENCY, FRACTURES): Vit D, 25-Hydroxy: 37 ng/mL (ref 30–89)

## 2011-12-27 LAB — LACTATE DEHYDROGENASE: LDH: 114 U/L (ref 94–250)

## 2011-12-27 LAB — CEA: CEA: <0.5 ng/mL (ref 0.0–5.0)

## 2011-12-27 NOTE — Progress Notes (Signed)
CC:   [name] Pounds, MD [name]. [name] Goodell, MD  DIAGNOSES: 1. History of stage II adenocarcinoma of the sigmoid colon (T4 N0 M0). 2. 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  CURRENT THERAPY:  Coumadin 5 mg p.o. daily alternating with 7.5 mg p.o. daily.  INTERIM HISTORY:  [name] comes in for his followup.  I see him yearly.  He has been doing okay.  He was found to have some thyroid nodules.  They were biopsied and found to be benign.  He is on Coumadin for his left leg DVT.  He apparently was taken off Coumadin for his thyroid biopsy.  He said that when he was taken off the Coumadin for a couple days, he developed another blood clot.  This certainly is quite unusual.  He has had no bleeding with the Coumadin.  His colostomy is working well.  He has had no problems with bleeding from the colostomy.  He has had no cough or shortness breath.  There has been no change in his medications at all.  PHYSICAL EXAMINATION:  General: This is a well-developed, well-nourished white gentleman in no obvious distress.  Vital signs:  Temperature of 97.5, pulse 51, respiratory rate 20, blood pressure 126/63.  Weight is 184.  Head and neck:  Normocephalic, atraumatic skull.  There are no ocular or oral lesions.  There are no palpable cervical or supraclavicular lymph nodes.  Lungs:  Clear to percussion and auscultation bilaterally.  Cardiac:  Regular rate and rhythm with a normal S1 and S2.  There are no murmurs, rubs, or bruits.  Abdomen: Soft with good bowel sounds.  His colostomy is in the left lower quadrant.  He has no fluid wave.  There is no palpable abdominal mass. There is no palpable hepatosplenomegaly.  Back:  No tenderness of the spine, ribs, or hips.  Extremities:  Shows some chronic nonpitting edema of the left leg.  He has some slight plethora of the left leg.  He has good range motion of his joints.  Neurological:  Shows no focal neurological  deficits.  LABORATORY STUDIES:  White cell count is 5, hemoglobin 13.5, hematocrit 39.6, platelet count 126,000.  IMPRESSION:  [name] is a 78 year old gentleman with a history of stage II adenocarcinoma of the sigmoid colon.  He was treated down in [place].  He underwent surgery back in December 2000.  He did have adjuvant chemoradiation therapy because of the locally advanced nature of the cancer.  He seemed to do well with Coumadin.  He is getting his levels checked monthly.  I will plan to see him back in another year.  I do not see that we need any x-rays on him.    ______________________________ [name], [name] PRE/MEDQ  D:  12/26/2011  T:  12/27/2011  Job:  [id number]

## 2012-01-01 ENCOUNTER — Telehealth: Payer: Self-pay | Admitting: *Deleted

## 2012-01-01 NOTE — Telephone Encounter (Signed)
Message copied by Anselm Jungling on Wed Jan 01, 2012  2:45 PM ------      Message from: Arlan Organ R      Created: Wed Jan 01, 2012  1:22 PM       Call and let him know that his lab work is all normal. Thanks. Cindee Lame

## 2012-01-01 NOTE — Telephone Encounter (Signed)
Called patient to let him know that labwork is normal per dr. Myna Hidalgo. Patient standing next to his wife.  Gave message to his wife

## 2012-01-09 DIAGNOSIS — I2699 Other pulmonary embolism without acute cor pulmonale: Secondary | ICD-10-CM | POA: Diagnosis not present

## 2012-01-09 DIAGNOSIS — Z7901 Long term (current) use of anticoagulants: Secondary | ICD-10-CM | POA: Diagnosis not present

## 2012-01-14 ENCOUNTER — Ambulatory Visit: Payer: Medicare Other | Admitting: Hematology & Oncology

## 2012-01-14 ENCOUNTER — Other Ambulatory Visit: Payer: Medicare Other | Admitting: Lab

## 2012-01-28 DIAGNOSIS — I2699 Other pulmonary embolism without acute cor pulmonale: Secondary | ICD-10-CM | POA: Diagnosis not present

## 2012-01-28 DIAGNOSIS — Z7901 Long term (current) use of anticoagulants: Secondary | ICD-10-CM | POA: Diagnosis not present

## 2012-02-13 DIAGNOSIS — Z7901 Long term (current) use of anticoagulants: Secondary | ICD-10-CM | POA: Diagnosis not present

## 2012-02-13 DIAGNOSIS — I2699 Other pulmonary embolism without acute cor pulmonale: Secondary | ICD-10-CM | POA: Diagnosis not present

## 2012-02-25 DIAGNOSIS — Z125 Encounter for screening for malignant neoplasm of prostate: Secondary | ICD-10-CM | POA: Diagnosis not present

## 2012-02-25 DIAGNOSIS — E785 Hyperlipidemia, unspecified: Secondary | ICD-10-CM | POA: Diagnosis not present

## 2012-02-25 DIAGNOSIS — Z7901 Long term (current) use of anticoagulants: Secondary | ICD-10-CM | POA: Diagnosis not present

## 2012-02-25 DIAGNOSIS — E041 Nontoxic single thyroid nodule: Secondary | ICD-10-CM | POA: Diagnosis not present

## 2012-02-25 DIAGNOSIS — I10 Essential (primary) hypertension: Secondary | ICD-10-CM | POA: Diagnosis not present

## 2012-03-03 DIAGNOSIS — Z0000 Encounter for general adult medical examination without abnormal findings: Secondary | ICD-10-CM | POA: Diagnosis not present

## 2012-03-03 DIAGNOSIS — I861 Scrotal varices: Secondary | ICD-10-CM | POA: Diagnosis not present

## 2012-03-03 DIAGNOSIS — Z23 Encounter for immunization: Secondary | ICD-10-CM | POA: Diagnosis not present

## 2012-03-03 DIAGNOSIS — G4733 Obstructive sleep apnea (adult) (pediatric): Secondary | ICD-10-CM | POA: Diagnosis not present

## 2012-03-03 DIAGNOSIS — E785 Hyperlipidemia, unspecified: Secondary | ICD-10-CM | POA: Diagnosis not present

## 2012-03-31 DIAGNOSIS — I2699 Other pulmonary embolism without acute cor pulmonale: Secondary | ICD-10-CM | POA: Diagnosis not present

## 2012-03-31 DIAGNOSIS — Z7901 Long term (current) use of anticoagulants: Secondary | ICD-10-CM | POA: Diagnosis not present

## 2012-05-05 DIAGNOSIS — Z7901 Long term (current) use of anticoagulants: Secondary | ICD-10-CM | POA: Diagnosis not present

## 2012-05-05 DIAGNOSIS — I2699 Other pulmonary embolism without acute cor pulmonale: Secondary | ICD-10-CM | POA: Diagnosis not present

## 2012-06-04 DIAGNOSIS — Z7901 Long term (current) use of anticoagulants: Secondary | ICD-10-CM | POA: Diagnosis not present

## 2012-06-04 DIAGNOSIS — I2699 Other pulmonary embolism without acute cor pulmonale: Secondary | ICD-10-CM | POA: Diagnosis not present

## 2012-07-07 DIAGNOSIS — I2699 Other pulmonary embolism without acute cor pulmonale: Secondary | ICD-10-CM | POA: Diagnosis not present

## 2012-07-07 DIAGNOSIS — Z7901 Long term (current) use of anticoagulants: Secondary | ICD-10-CM | POA: Diagnosis not present

## 2012-08-03 DIAGNOSIS — K922 Gastrointestinal hemorrhage, unspecified: Secondary | ICD-10-CM | POA: Diagnosis not present

## 2012-08-03 DIAGNOSIS — R05 Cough: Secondary | ICD-10-CM | POA: Diagnosis not present

## 2012-08-03 DIAGNOSIS — R1110 Vomiting, unspecified: Secondary | ICD-10-CM | POA: Diagnosis not present

## 2012-08-03 DIAGNOSIS — R059 Cough, unspecified: Secondary | ICD-10-CM | POA: Diagnosis not present

## 2012-08-03 DIAGNOSIS — R109 Unspecified abdominal pain: Secondary | ICD-10-CM | POA: Diagnosis not present

## 2012-08-03 DIAGNOSIS — R1084 Generalized abdominal pain: Secondary | ICD-10-CM | POA: Diagnosis not present

## 2012-08-03 DIAGNOSIS — I2699 Other pulmonary embolism without acute cor pulmonale: Secondary | ICD-10-CM | POA: Diagnosis not present

## 2012-08-03 DIAGNOSIS — K920 Hematemesis: Secondary | ICD-10-CM | POA: Diagnosis not present

## 2012-08-03 DIAGNOSIS — Z452 Encounter for adjustment and management of vascular access device: Secondary | ICD-10-CM | POA: Diagnosis not present

## 2012-08-03 DIAGNOSIS — I10 Essential (primary) hypertension: Secondary | ICD-10-CM | POA: Diagnosis not present

## 2012-08-03 DIAGNOSIS — D62 Acute posthemorrhagic anemia: Secondary | ICD-10-CM | POA: Diagnosis not present

## 2012-08-03 DIAGNOSIS — K8020 Calculus of gallbladder without cholecystitis without obstruction: Secondary | ICD-10-CM | POA: Diagnosis not present

## 2012-08-04 DIAGNOSIS — K2289 Other specified disease of esophagus: Secondary | ICD-10-CM | POA: Diagnosis not present

## 2012-08-04 DIAGNOSIS — K250 Acute gastric ulcer with hemorrhage: Secondary | ICD-10-CM | POA: Diagnosis not present

## 2012-08-04 DIAGNOSIS — K259 Gastric ulcer, unspecified as acute or chronic, without hemorrhage or perforation: Secondary | ICD-10-CM | POA: Diagnosis not present

## 2012-08-04 DIAGNOSIS — K228 Other specified diseases of esophagus: Secondary | ICD-10-CM | POA: Diagnosis not present

## 2012-08-04 DIAGNOSIS — K922 Gastrointestinal hemorrhage, unspecified: Secondary | ICD-10-CM | POA: Diagnosis not present

## 2012-08-04 DIAGNOSIS — K920 Hematemesis: Secondary | ICD-10-CM | POA: Diagnosis not present

## 2012-08-04 DIAGNOSIS — I2699 Other pulmonary embolism without acute cor pulmonale: Secondary | ICD-10-CM | POA: Diagnosis not present

## 2012-08-04 DIAGNOSIS — I10 Essential (primary) hypertension: Secondary | ICD-10-CM | POA: Diagnosis not present

## 2012-08-04 DIAGNOSIS — D62 Acute posthemorrhagic anemia: Secondary | ICD-10-CM | POA: Diagnosis not present

## 2012-08-05 DIAGNOSIS — K920 Hematemesis: Secondary | ICD-10-CM | POA: Diagnosis not present

## 2012-08-05 DIAGNOSIS — K250 Acute gastric ulcer with hemorrhage: Secondary | ICD-10-CM | POA: Diagnosis not present

## 2012-08-05 DIAGNOSIS — I10 Essential (primary) hypertension: Secondary | ICD-10-CM | POA: Diagnosis not present

## 2012-08-05 DIAGNOSIS — D62 Acute posthemorrhagic anemia: Secondary | ICD-10-CM | POA: Diagnosis not present

## 2012-08-05 DIAGNOSIS — I2699 Other pulmonary embolism without acute cor pulmonale: Secondary | ICD-10-CM | POA: Diagnosis not present

## 2012-08-05 DIAGNOSIS — K259 Gastric ulcer, unspecified as acute or chronic, without hemorrhage or perforation: Secondary | ICD-10-CM | POA: Diagnosis not present

## 2012-08-07 DIAGNOSIS — I10 Essential (primary) hypertension: Secondary | ICD-10-CM | POA: Diagnosis not present

## 2012-08-07 DIAGNOSIS — I2699 Other pulmonary embolism without acute cor pulmonale: Secondary | ICD-10-CM | POA: Diagnosis not present

## 2012-08-07 DIAGNOSIS — Z7901 Long term (current) use of anticoagulants: Secondary | ICD-10-CM | POA: Diagnosis not present

## 2012-08-07 DIAGNOSIS — K259 Gastric ulcer, unspecified as acute or chronic, without hemorrhage or perforation: Secondary | ICD-10-CM | POA: Diagnosis not present

## 2012-08-11 DIAGNOSIS — I2699 Other pulmonary embolism without acute cor pulmonale: Secondary | ICD-10-CM | POA: Diagnosis not present

## 2012-08-11 DIAGNOSIS — Z7901 Long term (current) use of anticoagulants: Secondary | ICD-10-CM | POA: Diagnosis not present

## 2012-08-13 DIAGNOSIS — H2510 Age-related nuclear cataract, unspecified eye: Secondary | ICD-10-CM | POA: Diagnosis not present

## 2012-09-01 DIAGNOSIS — K922 Gastrointestinal hemorrhage, unspecified: Secondary | ICD-10-CM | POA: Diagnosis not present

## 2012-09-01 DIAGNOSIS — G4733 Obstructive sleep apnea (adult) (pediatric): Secondary | ICD-10-CM | POA: Diagnosis not present

## 2012-09-01 DIAGNOSIS — K259 Gastric ulcer, unspecified as acute or chronic, without hemorrhage or perforation: Secondary | ICD-10-CM | POA: Diagnosis not present

## 2012-09-01 DIAGNOSIS — E785 Hyperlipidemia, unspecified: Secondary | ICD-10-CM | POA: Diagnosis not present

## 2012-09-01 DIAGNOSIS — I10 Essential (primary) hypertension: Secondary | ICD-10-CM | POA: Diagnosis not present

## 2012-09-01 DIAGNOSIS — Z7901 Long term (current) use of anticoagulants: Secondary | ICD-10-CM | POA: Diagnosis not present

## 2012-09-01 DIAGNOSIS — I2699 Other pulmonary embolism without acute cor pulmonale: Secondary | ICD-10-CM | POA: Diagnosis not present

## 2012-09-01 DIAGNOSIS — D649 Anemia, unspecified: Secondary | ICD-10-CM | POA: Diagnosis not present

## 2012-09-17 DIAGNOSIS — I2699 Other pulmonary embolism without acute cor pulmonale: Secondary | ICD-10-CM | POA: Diagnosis not present

## 2012-09-17 DIAGNOSIS — Z7901 Long term (current) use of anticoagulants: Secondary | ICD-10-CM | POA: Diagnosis not present

## 2012-10-01 DIAGNOSIS — Z7901 Long term (current) use of anticoagulants: Secondary | ICD-10-CM | POA: Diagnosis not present

## 2012-10-01 DIAGNOSIS — I2699 Other pulmonary embolism without acute cor pulmonale: Secondary | ICD-10-CM | POA: Diagnosis not present

## 2012-11-17 DIAGNOSIS — Z7901 Long term (current) use of anticoagulants: Secondary | ICD-10-CM | POA: Diagnosis not present

## 2012-11-17 DIAGNOSIS — I2699 Other pulmonary embolism without acute cor pulmonale: Secondary | ICD-10-CM | POA: Diagnosis not present

## 2012-12-24 ENCOUNTER — Other Ambulatory Visit (HOSPITAL_BASED_OUTPATIENT_CLINIC_OR_DEPARTMENT_OTHER): Payer: Medicare Other | Admitting: Lab

## 2012-12-24 ENCOUNTER — Ambulatory Visit (HOSPITAL_BASED_OUTPATIENT_CLINIC_OR_DEPARTMENT_OTHER): Payer: Medicare Other | Admitting: Hematology & Oncology

## 2012-12-24 VITALS — BP 115/55 | HR 50 | Temp 97.8°F | Resp 20 | Ht 62.0 in | Wt 179.0 lb

## 2012-12-24 DIAGNOSIS — I82409 Acute embolism and thrombosis of unspecified deep veins of unspecified lower extremity: Secondary | ICD-10-CM | POA: Diagnosis not present

## 2012-12-24 DIAGNOSIS — C189 Malignant neoplasm of colon, unspecified: Secondary | ICD-10-CM | POA: Diagnosis not present

## 2012-12-24 DIAGNOSIS — D689 Coagulation defect, unspecified: Secondary | ICD-10-CM

## 2012-12-24 DIAGNOSIS — Z85038 Personal history of other malignant neoplasm of large intestine: Secondary | ICD-10-CM

## 2012-12-24 LAB — COMPREHENSIVE METABOLIC PANEL
AST: 14 U/L (ref 0–37)
Albumin: 4.2 g/dL (ref 3.5–5.2)
Alkaline Phosphatase: 58 U/L (ref 39–117)
BUN: 18 mg/dL (ref 6–23)
Potassium: 4.3 mEq/L (ref 3.5–5.3)

## 2012-12-24 LAB — CBC WITH DIFFERENTIAL (CANCER CENTER ONLY)
BASO%: 0.4 % (ref 0.0–2.0)
EOS%: 2.7 % (ref 0.0–7.0)
HCT: 37.8 % — ABNORMAL LOW (ref 38.7–49.9)
LYMPH#: 1.4 10*3/uL (ref 0.9–3.3)
MCHC: 33.3 g/dL (ref 32.0–35.9)
NEUT#: 2.6 10*3/uL (ref 1.5–6.5)
NEUT%: 55 % (ref 40.0–80.0)
Platelets: 124 10*3/uL — ABNORMAL LOW (ref 145–400)
RDW: 13.9 % (ref 11.1–15.7)

## 2012-12-24 NOTE — Progress Notes (Signed)
This office note has been dictated.

## 2012-12-25 ENCOUNTER — Ambulatory Visit: Payer: Medicare Other | Admitting: Hematology & Oncology

## 2012-12-25 ENCOUNTER — Other Ambulatory Visit: Payer: Medicare Other | Admitting: Lab

## 2012-12-25 NOTE — Progress Notes (Signed)
CC:   Gwen Pounds, MD Wilhemina Bonito. Marina Goodell, MD  DIAGNOSES: 1. Stage II (T4 N0 M0) adenocarcinoma of the sigmoid colon. 2. Recurrent deep venous thrombosis of the left leg.  CURRENT THERAPY:  Coumadin 5 mg p.o. q. day alternating with 7.5 mg p.o. q. day.  INTERIM HISTORY:  Mr. Paget comes in for followup.  He is doing okay. He did have an upper GI bleed back in February.  He is on Coumadin.  One would think that the Coumadin probably was the reason for the bleed.  He was on, I think, a baby aspirin also.  He was not taking nonsteroidals.  He is doing well right now.  He is having no abdominal pain.  There is no cough or shortness of breath.  He has had no leg swelling.  His legs do swell on occasion.  His last CEA level that we checked on him was less than 0.5.  He does have some hypoglycemia in the morning from his symptoms that he tells me.  PHYSICAL EXAMINATION:  General:  This is a well-developed, well- nourished white gentleman in no obvious distress.  Vital signs: Temperature of 97.8, pulse 50, respiratory rate 18, blood pressure 115/55.  Weight is 179.  Head and neck:  Normocephalic, atraumatic skull.  There are no ocular or oral lesions.  There are no palpable cervical or supraclavicular lymph nodes.  Lungs:  Clear bilaterally. Cardiac:  Regular rate and rhythm with a normal S1, S2.  There are no murmurs, rubs, or bruits.  Abdomen:  Soft with good bowel sounds.  There is no palpable abdominal mass.  There is no fluid wave.  There is no palpable hepatosplenomegaly.  He has a well-healed laparotomy scar. Extremities:  Show some chronic nonpitting edema of his lower legs.  He has good pulses in his distal extremities.  Skin:  No rash, ecchymosis, or petechiae.  Neurological:  No focal neurological deficits.  LABORATORIES:  White blood cell count is 4.8, hemoglobin 12.6, hematocrit 37.9, platelet count 124.  IMPRESSION:  Mr. Babich is a 79 year old gentleman with a  remote history of stage II adenocarcinoma of the sigmoid colon.  This was about 14 years ago.  He received adjuvant chemo and radiation therapy.  Hopefully, there will be no further bleeding issues with respect to the Coumadin.  He needs to be on Coumadin because of the DVT.  I would not consider switching him over to one of the newer oral anticoagulants.  He has done well with Coumadin.  We will get him back in 1 more year as he would like to come back for followup.    ______________________________ Josph Macho, [name] PRE/MEDQ  D:  12/24/2012  T:  12/25/2012  Job:  [id number]

## 2012-12-28 ENCOUNTER — Telehealth: Payer: Self-pay | Admitting: Hematology & Oncology

## 2012-12-28 NOTE — Telephone Encounter (Addendum)
Message copied by Cathi Roan on Mon Dec 28, 2012 11:29 AM ------      Message from: Josph Macho      Created: Fri Dec 25, 2012  5:40 PM       Call - labs are ok!!  Cindee Lame ------  12-28-12  Called and left message on patients home phone regarding above MD message, advised if any question to call this office. Lupita Raider LPN

## 2012-12-29 DIAGNOSIS — Z7901 Long term (current) use of anticoagulants: Secondary | ICD-10-CM | POA: Diagnosis not present

## 2012-12-29 DIAGNOSIS — I2699 Other pulmonary embolism without acute cor pulmonale: Secondary | ICD-10-CM | POA: Diagnosis not present

## 2013-01-26 DIAGNOSIS — I2699 Other pulmonary embolism without acute cor pulmonale: Secondary | ICD-10-CM | POA: Diagnosis not present

## 2013-01-26 DIAGNOSIS — Z7901 Long term (current) use of anticoagulants: Secondary | ICD-10-CM | POA: Diagnosis not present

## 2013-03-04 DIAGNOSIS — E785 Hyperlipidemia, unspecified: Secondary | ICD-10-CM | POA: Diagnosis not present

## 2013-03-04 DIAGNOSIS — Z125 Encounter for screening for malignant neoplasm of prostate: Secondary | ICD-10-CM | POA: Diagnosis not present

## 2013-03-04 DIAGNOSIS — I10 Essential (primary) hypertension: Secondary | ICD-10-CM | POA: Diagnosis not present

## 2013-03-11 DIAGNOSIS — IMO0002 Reserved for concepts with insufficient information to code with codable children: Secondary | ICD-10-CM | POA: Diagnosis not present

## 2013-03-11 DIAGNOSIS — Z0000 Encounter for general adult medical examination without abnormal findings: Secondary | ICD-10-CM | POA: Diagnosis not present

## 2013-03-11 DIAGNOSIS — K259 Gastric ulcer, unspecified as acute or chronic, without hemorrhage or perforation: Secondary | ICD-10-CM | POA: Diagnosis not present

## 2013-03-11 DIAGNOSIS — H9190 Unspecified hearing loss, unspecified ear: Secondary | ICD-10-CM | POA: Diagnosis not present

## 2013-03-11 DIAGNOSIS — Z7901 Long term (current) use of anticoagulants: Secondary | ICD-10-CM | POA: Diagnosis not present

## 2013-03-11 DIAGNOSIS — G4700 Insomnia, unspecified: Secondary | ICD-10-CM | POA: Diagnosis not present

## 2013-03-11 DIAGNOSIS — K922 Gastrointestinal hemorrhage, unspecified: Secondary | ICD-10-CM | POA: Diagnosis not present

## 2013-03-11 DIAGNOSIS — E785 Hyperlipidemia, unspecified: Secondary | ICD-10-CM | POA: Diagnosis not present

## 2013-03-11 DIAGNOSIS — Z1331 Encounter for screening for depression: Secondary | ICD-10-CM | POA: Diagnosis not present

## 2013-03-11 DIAGNOSIS — Z23 Encounter for immunization: Secondary | ICD-10-CM | POA: Diagnosis not present

## 2013-03-30 DIAGNOSIS — R599 Enlarged lymph nodes, unspecified: Secondary | ICD-10-CM | POA: Diagnosis not present

## 2013-03-30 DIAGNOSIS — B356 Tinea cruris: Secondary | ICD-10-CM | POA: Diagnosis not present

## 2013-04-05 DIAGNOSIS — Z7901 Long term (current) use of anticoagulants: Secondary | ICD-10-CM | POA: Diagnosis not present

## 2013-04-05 DIAGNOSIS — I2699 Other pulmonary embolism without acute cor pulmonale: Secondary | ICD-10-CM | POA: Diagnosis not present

## 2013-04-14 DIAGNOSIS — Z7901 Long term (current) use of anticoagulants: Secondary | ICD-10-CM | POA: Diagnosis not present

## 2013-04-14 DIAGNOSIS — I2699 Other pulmonary embolism without acute cor pulmonale: Secondary | ICD-10-CM | POA: Diagnosis not present

## 2013-05-18 DIAGNOSIS — I2699 Other pulmonary embolism without acute cor pulmonale: Secondary | ICD-10-CM | POA: Diagnosis not present

## 2013-05-18 DIAGNOSIS — Z7901 Long term (current) use of anticoagulants: Secondary | ICD-10-CM | POA: Diagnosis not present

## 2013-06-22 DIAGNOSIS — Z7901 Long term (current) use of anticoagulants: Secondary | ICD-10-CM | POA: Diagnosis not present

## 2013-06-22 DIAGNOSIS — I2699 Other pulmonary embolism without acute cor pulmonale: Secondary | ICD-10-CM | POA: Diagnosis not present

## 2013-07-28 DIAGNOSIS — I2699 Other pulmonary embolism without acute cor pulmonale: Secondary | ICD-10-CM | POA: Diagnosis not present

## 2013-07-28 DIAGNOSIS — Z7901 Long term (current) use of anticoagulants: Secondary | ICD-10-CM | POA: Diagnosis not present

## 2013-08-24 DIAGNOSIS — I2699 Other pulmonary embolism without acute cor pulmonale: Secondary | ICD-10-CM | POA: Diagnosis not present

## 2013-08-24 DIAGNOSIS — Z7901 Long term (current) use of anticoagulants: Secondary | ICD-10-CM | POA: Diagnosis not present

## 2013-08-25 DIAGNOSIS — Z23 Encounter for immunization: Secondary | ICD-10-CM | POA: Diagnosis not present

## 2013-08-25 DIAGNOSIS — S61209A Unspecified open wound of unspecified finger without damage to nail, initial encounter: Secondary | ICD-10-CM | POA: Diagnosis not present

## 2013-09-06 DIAGNOSIS — H2510 Age-related nuclear cataract, unspecified eye: Secondary | ICD-10-CM | POA: Diagnosis not present

## 2013-09-13 DIAGNOSIS — N318 Other neuromuscular dysfunction of bladder: Secondary | ICD-10-CM | POA: Diagnosis not present

## 2013-09-13 DIAGNOSIS — E785 Hyperlipidemia, unspecified: Secondary | ICD-10-CM | POA: Diagnosis not present

## 2013-09-13 DIAGNOSIS — Z85038 Personal history of other malignant neoplasm of large intestine: Secondary | ICD-10-CM | POA: Diagnosis not present

## 2013-09-13 DIAGNOSIS — Z7901 Long term (current) use of anticoagulants: Secondary | ICD-10-CM | POA: Diagnosis not present

## 2013-09-13 DIAGNOSIS — Z1331 Encounter for screening for depression: Secondary | ICD-10-CM | POA: Diagnosis not present

## 2013-09-13 DIAGNOSIS — I2699 Other pulmonary embolism without acute cor pulmonale: Secondary | ICD-10-CM | POA: Diagnosis not present

## 2013-09-13 DIAGNOSIS — G4733 Obstructive sleep apnea (adult) (pediatric): Secondary | ICD-10-CM | POA: Diagnosis not present

## 2013-09-13 DIAGNOSIS — Z6832 Body mass index (BMI) 32.0-32.9, adult: Secondary | ICD-10-CM | POA: Diagnosis not present

## 2013-09-13 DIAGNOSIS — I10 Essential (primary) hypertension: Secondary | ICD-10-CM | POA: Diagnosis not present

## 2013-09-28 DIAGNOSIS — Z7901 Long term (current) use of anticoagulants: Secondary | ICD-10-CM | POA: Diagnosis not present

## 2013-09-28 DIAGNOSIS — I2699 Other pulmonary embolism without acute cor pulmonale: Secondary | ICD-10-CM | POA: Diagnosis not present

## 2013-11-02 DIAGNOSIS — I2699 Other pulmonary embolism without acute cor pulmonale: Secondary | ICD-10-CM | POA: Diagnosis not present

## 2013-11-02 DIAGNOSIS — Z7901 Long term (current) use of anticoagulants: Secondary | ICD-10-CM | POA: Diagnosis not present

## 2013-12-08 DIAGNOSIS — I2699 Other pulmonary embolism without acute cor pulmonale: Secondary | ICD-10-CM | POA: Diagnosis not present

## 2013-12-08 DIAGNOSIS — Z7901 Long term (current) use of anticoagulants: Secondary | ICD-10-CM | POA: Diagnosis not present

## 2013-12-21 ENCOUNTER — Encounter: Payer: Self-pay | Admitting: Hematology & Oncology

## 2013-12-21 ENCOUNTER — Ambulatory Visit (HOSPITAL_BASED_OUTPATIENT_CLINIC_OR_DEPARTMENT_OTHER): Payer: Medicare Other | Admitting: Hematology & Oncology

## 2013-12-21 ENCOUNTER — Other Ambulatory Visit (HOSPITAL_BASED_OUTPATIENT_CLINIC_OR_DEPARTMENT_OTHER): Payer: Medicare Other | Admitting: Lab

## 2013-12-21 VITALS — BP 119/59 | HR 67 | Temp 98.0°F | Resp 16 | Ht 62.0 in | Wt 180.0 lb

## 2013-12-21 DIAGNOSIS — D689 Coagulation defect, unspecified: Secondary | ICD-10-CM | POA: Diagnosis not present

## 2013-12-21 DIAGNOSIS — Z85038 Personal history of other malignant neoplasm of large intestine: Secondary | ICD-10-CM

## 2013-12-21 DIAGNOSIS — Z86718 Personal history of other venous thrombosis and embolism: Secondary | ICD-10-CM | POA: Diagnosis not present

## 2013-12-21 DIAGNOSIS — Z7901 Long term (current) use of anticoagulants: Secondary | ICD-10-CM

## 2013-12-21 DIAGNOSIS — D509 Iron deficiency anemia, unspecified: Secondary | ICD-10-CM

## 2013-12-21 DIAGNOSIS — M818 Other osteoporosis without current pathological fracture: Secondary | ICD-10-CM

## 2013-12-21 LAB — PROTIME-INR (CHCC SATELLITE)
INR: 2 (ref 2.0–3.5)
PROTIME: 24 s — AB (ref 10.6–13.4)

## 2013-12-21 LAB — IRON AND TIBC CHCC
%SAT: 40 % (ref 20–55)
Iron: 137 ug/dL (ref 42–163)
TIBC: 344 ug/dL (ref 202–409)
UIBC: 207 ug/dL (ref 117–376)

## 2013-12-21 LAB — COMPREHENSIVE METABOLIC PANEL
ALBUMIN: 4.7 g/dL (ref 3.5–5.2)
ALT: 9 U/L (ref 0–53)
AST: 15 U/L (ref 0–37)
Alkaline Phosphatase: 61 U/L (ref 39–117)
BUN: 19 mg/dL (ref 6–23)
CALCIUM: 9.1 mg/dL (ref 8.4–10.5)
CHLORIDE: 105 meq/L (ref 96–112)
CO2: 26 meq/L (ref 19–32)
Creatinine, Ser: 1.19 mg/dL (ref 0.50–1.35)
Glucose, Bld: 87 mg/dL (ref 70–99)
POTASSIUM: 4.1 meq/L (ref 3.5–5.3)
SODIUM: 142 meq/L (ref 135–145)
TOTAL PROTEIN: 6.9 g/dL (ref 6.0–8.3)
Total Bilirubin: 0.8 mg/dL (ref 0.2–1.2)

## 2013-12-21 LAB — CBC WITH DIFFERENTIAL (CANCER CENTER ONLY)
BASO#: 0 10*3/uL (ref 0.0–0.2)
BASO%: 0.5 % (ref 0.0–2.0)
EOS ABS: 0.1 10*3/uL (ref 0.0–0.5)
EOS%: 3 % (ref 0.0–7.0)
HCT: 40 % (ref 38.7–49.9)
HGB: 13.6 g/dL (ref 13.0–17.1)
LYMPH#: 1.1 10*3/uL (ref 0.9–3.3)
LYMPH%: 25.9 % (ref 14.0–48.0)
MCH: 32.4 pg (ref 28.0–33.4)
MCHC: 34 g/dL (ref 32.0–35.9)
MCV: 95 fL (ref 82–98)
MONO#: 0.5 10*3/uL (ref 0.1–0.9)
MONO%: 10.4 % (ref 0.0–13.0)
NEUT#: 2.6 10*3/uL (ref 1.5–6.5)
NEUT%: 60.2 % (ref 40.0–80.0)
PLATELETS: 135 10*3/uL — AB (ref 145–400)
RBC: 4.2 10*6/uL (ref 4.20–5.70)
RDW: 12.6 % (ref 11.1–15.7)
WBC: 4.3 10*3/uL (ref 4.0–10.0)

## 2013-12-21 LAB — CEA: CEA: 0.6 ng/mL (ref 0.0–5.0)

## 2013-12-21 LAB — FERRITIN CHCC: Ferritin: 40 ng/ml (ref 22–316)

## 2013-12-21 NOTE — Progress Notes (Signed)
Hematology and Oncology Follow Up Visit  [name] [id number] 11/07/1933 79 y.o. 12/21/2013   Principle Diagnosis:  1. Stage II (T4 N0 M0) adenocarcinoma of the sigmoid colon. 2. Recurrent deep venous thrombosis of the left leg.  Current Therapy:   Coumadin 5 mg p.o. q. day alternating with 2.5 mg p.o. q. day.     Interim History:  Mr.  [name] is back for followup. We see him yearly. He's been doing well. He's had no problems since we last saw him. He's had no nausea or vomiting. He's had no bleeding. He's had a little bit of pain at his ostomy site.  There's been no cough. He's had no bleeding. He's had no leg swelling. He's had no weight loss weight gain.  There's been no change in his medications.  Medications: Current outpatient prescriptions:aspirin 81 MG tablet, Take 81 mg by mouth daily., Disp: , Rfl: ;  Cholecalciferol (VITAMIN D-3 PO), Take 2,000 Units by mouth every morning., Disp: , Rfl: ;  COUMADIN 5 MG tablet, Mon-Tues-Wed-Fri is 5 mg. Thurs is 2.5 mg, Disp: , Rfl: ;  Cyanocobalamin (VITAMIN B 12 PO), Take 1,000 Units by mouth 2 (two) times daily., Disp: , Rfl:  Diphenhydramine-APAP, sleep, (TYLENOL PM EXTRA STRENGTH PO), Take by mouth at bedtime as needed and may repeat dose one time if needed., Disp: , Rfl: ;  Glucosamine-Chondroit-Vit C-Mn (GLUCOSAMINE 1500 COMPLEX) CAPS, Take by mouth every morning., Disp: , Rfl: ;  HYDROcodone-acetaminophen (VICODIN) 5-500 MG per tablet, Take 1 tablet by mouth as needed. Taking 5-350, Disp: , Rfl:  lisinopril (PRINIVIL,ZESTRIL) 10 MG tablet, Take 10 mg by mouth daily., Disp: , Rfl: ;  Multiple Vitamin (MULTI-VITAMIN DAILY PO), Take by mouth every morning., Disp: , Rfl: ;  Omega-3 Fatty Acids (FISH OIL) 1200 MG CAPS, Take by mouth every morning., Disp: , Rfl: ;  pantoprazole (PROTONIX) 40 MG tablet, Take 40 mg by mouth daily. , Disp: , Rfl: ;  Probiotic Product (PROBIOTIC PO), Take by mouth every morning. [name], Disp: , Rfl:   simvastatin (ZOCOR) 10 MG tablet, Take 10 mg by mouth at bedtime., Disp: , Rfl:   Allergies:  Allergies  Allergen Reactions  . Codeine     REACTION: hallucinations  . Meperidine Hcl     REACTION: hallucinations  . Promethazine Hcl     Past Medical History, Surgical history, Social history, and Family History were reviewed and updated.  Review of Systems: As above  Physical Exam:  height is 5\' 2"  (1.575 m) and weight is 180 lb (81.647 kg). His oral temperature is 98 F (36.7 C). His blood pressure is 119/59 and his pulse is 67. His respiration is 16.   Well-developed and well-nourished white woman in no obvious distress. His head and neck exam shows no ocular or oral lesion. There are no palpable cervical or supraclavicular lymph nodes. Lungs are clear bilateral. Cardiac exam regular in rhythm with no murmurs rubs or bruits. Abdomen is soft. She has good bowel sounds. His ostomy is in the left lower quadrant. There is some slight tenderness about the ostomy site. He is no palpable liver or spleen to. Back exam shows no tenderness over the spine ribs or hips. Extremities shows no clubbing cyanosis or edema. Neurological exam shows no focal neurological deficits. Skin exam no rashes.  Lab Results  Component Value Date   WBC 4.3 12/21/2013   HGB 13.6 12/21/2013   HCT 40.0 12/21/2013   MCV 95 12/21/2013   PLT 135*  12/21/2013     Chemistry      Component Value Date/Time   NA 135 12/24/2012 0854   K 4.3 12/24/2012 0854   CL 102 12/24/2012 0854   CO2 24 12/24/2012 0854   BUN 18 12/24/2012 0854   CREATININE 1.12 12/24/2012 0854      Component Value Date/Time   CALCIUM 9.1 12/24/2012 0854   ALKPHOS 58 12/24/2012 0854   AST 14 12/24/2012 0854   ALT 11 12/24/2012 0854   BILITOT 0.8 12/24/2012 0854         Impression and Plan: [name] is 80 year old gentleman with a past history of stage II colon cancer. This was probably 12 years ago. This, and mama, is not a problem and he is sure of  this.  We will plan to get him back in another year. Preva I am sending iron studies on him. He does state somewhat more fatigued. Although he is not anemic, I want to make sure that there is no issues with his iron levels.   Volanda Napoleon, MD 7/7/201512:56 PM

## 2013-12-22 ENCOUNTER — Telehealth: Payer: Self-pay | Admitting: *Deleted

## 2013-12-22 NOTE — Telephone Encounter (Addendum)
Message copied by Lenn Sink on Wed Dec 22, 2013 12:39 PM ------      Message from: Burney Gauze R      Created: Tue Dec 21, 2013  1:51 PM       Cal -- iron is ok.  pete ------Left pt a message that iron is okay.

## 2013-12-23 ENCOUNTER — Other Ambulatory Visit: Payer: Medicare Other | Admitting: Lab

## 2013-12-23 ENCOUNTER — Ambulatory Visit: Payer: Medicare Other | Admitting: Hematology & Oncology

## 2014-01-11 DIAGNOSIS — I2699 Other pulmonary embolism without acute cor pulmonale: Secondary | ICD-10-CM | POA: Diagnosis not present

## 2014-01-11 DIAGNOSIS — Z7901 Long term (current) use of anticoagulants: Secondary | ICD-10-CM | POA: Diagnosis not present

## 2014-02-08 DIAGNOSIS — Z7901 Long term (current) use of anticoagulants: Secondary | ICD-10-CM | POA: Diagnosis not present

## 2014-02-08 DIAGNOSIS — R5381 Other malaise: Secondary | ICD-10-CM | POA: Diagnosis not present

## 2014-02-08 DIAGNOSIS — R0609 Other forms of dyspnea: Secondary | ICD-10-CM | POA: Diagnosis not present

## 2014-02-08 DIAGNOSIS — I2699 Other pulmonary embolism without acute cor pulmonale: Secondary | ICD-10-CM | POA: Diagnosis not present

## 2014-02-08 DIAGNOSIS — D649 Anemia, unspecified: Secondary | ICD-10-CM | POA: Diagnosis not present

## 2014-02-08 DIAGNOSIS — R0989 Other specified symptoms and signs involving the circulatory and respiratory systems: Secondary | ICD-10-CM | POA: Diagnosis not present

## 2014-02-08 DIAGNOSIS — R5383 Other fatigue: Secondary | ICD-10-CM | POA: Diagnosis not present

## 2014-02-25 DIAGNOSIS — R011 Cardiac murmur, unspecified: Secondary | ICD-10-CM | POA: Diagnosis not present

## 2014-02-25 DIAGNOSIS — R0602 Shortness of breath: Secondary | ICD-10-CM | POA: Diagnosis not present

## 2014-02-25 DIAGNOSIS — R5381 Other malaise: Secondary | ICD-10-CM | POA: Diagnosis not present

## 2014-02-28 ENCOUNTER — Encounter: Payer: Self-pay | Admitting: Internal Medicine

## 2014-03-15 DIAGNOSIS — I10 Essential (primary) hypertension: Secondary | ICD-10-CM | POA: Diagnosis not present

## 2014-03-15 DIAGNOSIS — E041 Nontoxic single thyroid nodule: Secondary | ICD-10-CM | POA: Diagnosis not present

## 2014-03-15 DIAGNOSIS — Z125 Encounter for screening for malignant neoplasm of prostate: Secondary | ICD-10-CM | POA: Diagnosis not present

## 2014-03-15 DIAGNOSIS — R809 Proteinuria, unspecified: Secondary | ICD-10-CM | POA: Diagnosis not present

## 2014-03-15 DIAGNOSIS — I2699 Other pulmonary embolism without acute cor pulmonale: Secondary | ICD-10-CM | POA: Diagnosis not present

## 2014-03-15 DIAGNOSIS — E785 Hyperlipidemia, unspecified: Secondary | ICD-10-CM | POA: Diagnosis not present

## 2014-03-15 DIAGNOSIS — Z7901 Long term (current) use of anticoagulants: Secondary | ICD-10-CM | POA: Diagnosis not present

## 2014-03-22 DIAGNOSIS — R0602 Shortness of breath: Secondary | ICD-10-CM | POA: Diagnosis not present

## 2014-03-22 DIAGNOSIS — R5381 Other malaise: Secondary | ICD-10-CM | POA: Diagnosis not present

## 2014-03-22 DIAGNOSIS — Z85038 Personal history of other malignant neoplasm of large intestine: Secondary | ICD-10-CM | POA: Diagnosis not present

## 2014-03-22 DIAGNOSIS — N433 Hydrocele, unspecified: Secondary | ICD-10-CM | POA: Diagnosis not present

## 2014-03-22 DIAGNOSIS — R011 Cardiac murmur, unspecified: Secondary | ICD-10-CM | POA: Diagnosis not present

## 2014-03-22 DIAGNOSIS — Z79899 Other long term (current) drug therapy: Secondary | ICD-10-CM | POA: Diagnosis not present

## 2014-03-22 DIAGNOSIS — N3281 Overactive bladder: Secondary | ICD-10-CM | POA: Diagnosis not present

## 2014-03-22 DIAGNOSIS — Z1212 Encounter for screening for malignant neoplasm of rectum: Secondary | ICD-10-CM | POA: Diagnosis not present

## 2014-03-22 DIAGNOSIS — I10 Essential (primary) hypertension: Secondary | ICD-10-CM | POA: Diagnosis not present

## 2014-03-22 DIAGNOSIS — I2699 Other pulmonary embolism without acute cor pulmonale: Secondary | ICD-10-CM | POA: Diagnosis not present

## 2014-03-22 DIAGNOSIS — I081 Rheumatic disorders of both mitral and tricuspid valves: Secondary | ICD-10-CM | POA: Diagnosis not present

## 2014-03-22 DIAGNOSIS — D696 Thrombocytopenia, unspecified: Secondary | ICD-10-CM | POA: Diagnosis not present

## 2014-03-22 DIAGNOSIS — I272 Other secondary pulmonary hypertension: Secondary | ICD-10-CM | POA: Diagnosis not present

## 2014-03-22 DIAGNOSIS — E785 Hyperlipidemia, unspecified: Secondary | ICD-10-CM | POA: Diagnosis not present

## 2014-03-22 DIAGNOSIS — R5383 Other fatigue: Secondary | ICD-10-CM | POA: Diagnosis not present

## 2014-03-22 DIAGNOSIS — Z23 Encounter for immunization: Secondary | ICD-10-CM | POA: Diagnosis not present

## 2014-03-22 DIAGNOSIS — I519 Heart disease, unspecified: Secondary | ICD-10-CM | POA: Diagnosis not present

## 2014-04-05 DIAGNOSIS — I2699 Other pulmonary embolism without acute cor pulmonale: Secondary | ICD-10-CM | POA: Diagnosis not present

## 2014-04-05 DIAGNOSIS — Z7901 Long term (current) use of anticoagulants: Secondary | ICD-10-CM | POA: Diagnosis not present

## 2014-05-03 DIAGNOSIS — I2699 Other pulmonary embolism without acute cor pulmonale: Secondary | ICD-10-CM | POA: Diagnosis not present

## 2014-05-03 DIAGNOSIS — Z7901 Long term (current) use of anticoagulants: Secondary | ICD-10-CM | POA: Diagnosis not present

## 2014-05-31 DIAGNOSIS — I2699 Other pulmonary embolism without acute cor pulmonale: Secondary | ICD-10-CM | POA: Diagnosis not present

## 2014-05-31 DIAGNOSIS — Z7901 Long term (current) use of anticoagulants: Secondary | ICD-10-CM | POA: Diagnosis not present

## 2014-06-14 DIAGNOSIS — I2699 Other pulmonary embolism without acute cor pulmonale: Secondary | ICD-10-CM | POA: Diagnosis not present

## 2014-06-14 DIAGNOSIS — Z7901 Long term (current) use of anticoagulants: Secondary | ICD-10-CM | POA: Diagnosis not present

## 2014-07-12 DIAGNOSIS — I2699 Other pulmonary embolism without acute cor pulmonale: Secondary | ICD-10-CM | POA: Diagnosis not present

## 2014-07-12 DIAGNOSIS — Z7901 Long term (current) use of anticoagulants: Secondary | ICD-10-CM | POA: Diagnosis not present

## 2014-08-08 DIAGNOSIS — I2699 Other pulmonary embolism without acute cor pulmonale: Secondary | ICD-10-CM | POA: Diagnosis not present

## 2014-08-08 DIAGNOSIS — Z7901 Long term (current) use of anticoagulants: Secondary | ICD-10-CM | POA: Diagnosis not present

## 2014-09-06 DIAGNOSIS — R109 Unspecified abdominal pain: Secondary | ICD-10-CM | POA: Diagnosis not present

## 2014-09-06 DIAGNOSIS — I2699 Other pulmonary embolism without acute cor pulmonale: Secondary | ICD-10-CM | POA: Diagnosis not present

## 2014-09-06 DIAGNOSIS — Z23 Encounter for immunization: Secondary | ICD-10-CM | POA: Diagnosis not present

## 2014-09-06 DIAGNOSIS — Z7901 Long term (current) use of anticoagulants: Secondary | ICD-10-CM | POA: Diagnosis not present

## 2014-09-06 DIAGNOSIS — Z5189 Encounter for other specified aftercare: Secondary | ICD-10-CM | POA: Diagnosis not present

## 2014-09-21 ENCOUNTER — Encounter: Payer: Self-pay | Admitting: Internal Medicine

## 2014-09-23 DIAGNOSIS — Z6831 Body mass index (BMI) 31.0-31.9, adult: Secondary | ICD-10-CM | POA: Diagnosis not present

## 2014-09-23 DIAGNOSIS — D649 Anemia, unspecified: Secondary | ICD-10-CM | POA: Diagnosis not present

## 2014-09-23 DIAGNOSIS — K259 Gastric ulcer, unspecified as acute or chronic, without hemorrhage or perforation: Secondary | ICD-10-CM | POA: Diagnosis not present

## 2014-09-23 DIAGNOSIS — I10 Essential (primary) hypertension: Secondary | ICD-10-CM | POA: Diagnosis not present

## 2014-09-23 DIAGNOSIS — N3281 Overactive bladder: Secondary | ICD-10-CM | POA: Diagnosis not present

## 2014-09-23 DIAGNOSIS — Z1389 Encounter for screening for other disorder: Secondary | ICD-10-CM | POA: Diagnosis not present

## 2014-09-23 DIAGNOSIS — G4733 Obstructive sleep apnea (adult) (pediatric): Secondary | ICD-10-CM | POA: Diagnosis not present

## 2014-09-23 DIAGNOSIS — E785 Hyperlipidemia, unspecified: Secondary | ICD-10-CM | POA: Diagnosis not present

## 2014-09-23 DIAGNOSIS — Z7901 Long term (current) use of anticoagulants: Secondary | ICD-10-CM | POA: Diagnosis not present

## 2014-09-23 DIAGNOSIS — Z5189 Encounter for other specified aftercare: Secondary | ICD-10-CM | POA: Diagnosis not present

## 2014-10-04 DIAGNOSIS — Z7901 Long term (current) use of anticoagulants: Secondary | ICD-10-CM | POA: Diagnosis not present

## 2014-10-04 DIAGNOSIS — I2699 Other pulmonary embolism without acute cor pulmonale: Secondary | ICD-10-CM | POA: Diagnosis not present

## 2014-10-18 DIAGNOSIS — R1032 Left lower quadrant pain: Secondary | ICD-10-CM | POA: Diagnosis not present

## 2014-10-25 DIAGNOSIS — H40013 Open angle with borderline findings, low risk, bilateral: Secondary | ICD-10-CM | POA: Diagnosis not present

## 2014-11-02 DIAGNOSIS — Z7901 Long term (current) use of anticoagulants: Secondary | ICD-10-CM | POA: Diagnosis not present

## 2014-11-02 DIAGNOSIS — I2699 Other pulmonary embolism without acute cor pulmonale: Secondary | ICD-10-CM | POA: Diagnosis not present

## 2014-11-18 DIAGNOSIS — Z85038 Personal history of other malignant neoplasm of large intestine: Secondary | ICD-10-CM | POA: Diagnosis not present

## 2014-11-18 DIAGNOSIS — Z79891 Long term (current) use of opiate analgesic: Secondary | ICD-10-CM | POA: Diagnosis not present

## 2014-11-18 DIAGNOSIS — Z9221 Personal history of antineoplastic chemotherapy: Secondary | ICD-10-CM | POA: Diagnosis not present

## 2014-11-18 DIAGNOSIS — Z7982 Long term (current) use of aspirin: Secondary | ICD-10-CM | POA: Diagnosis not present

## 2014-11-18 DIAGNOSIS — R011 Cardiac murmur, unspecified: Secondary | ICD-10-CM | POA: Diagnosis not present

## 2014-11-18 DIAGNOSIS — Z933 Colostomy status: Secondary | ICD-10-CM | POA: Diagnosis not present

## 2014-11-18 DIAGNOSIS — Z86718 Personal history of other venous thrombosis and embolism: Secondary | ICD-10-CM | POA: Diagnosis not present

## 2014-11-18 DIAGNOSIS — E785 Hyperlipidemia, unspecified: Secondary | ICD-10-CM | POA: Diagnosis not present

## 2014-11-18 DIAGNOSIS — Z923 Personal history of irradiation: Secondary | ICD-10-CM | POA: Diagnosis not present

## 2014-11-18 DIAGNOSIS — Z885 Allergy status to narcotic agent status: Secondary | ICD-10-CM | POA: Diagnosis not present

## 2014-11-18 DIAGNOSIS — K5730 Diverticulosis of large intestine without perforation or abscess without bleeding: Secondary | ICD-10-CM | POA: Diagnosis not present

## 2014-11-18 DIAGNOSIS — K219 Gastro-esophageal reflux disease without esophagitis: Secondary | ICD-10-CM | POA: Diagnosis not present

## 2014-11-18 DIAGNOSIS — Z9049 Acquired absence of other specified parts of digestive tract: Secondary | ICD-10-CM | POA: Diagnosis not present

## 2014-11-18 DIAGNOSIS — G4730 Sleep apnea, unspecified: Secondary | ICD-10-CM | POA: Diagnosis not present

## 2014-11-18 DIAGNOSIS — Z79899 Other long term (current) drug therapy: Secondary | ICD-10-CM | POA: Diagnosis not present

## 2014-11-18 DIAGNOSIS — E669 Obesity, unspecified: Secondary | ICD-10-CM | POA: Diagnosis not present

## 2014-11-18 DIAGNOSIS — Z86711 Personal history of pulmonary embolism: Secondary | ICD-10-CM | POA: Diagnosis not present

## 2014-11-18 DIAGNOSIS — Z8711 Personal history of peptic ulcer disease: Secondary | ICD-10-CM | POA: Diagnosis not present

## 2014-11-18 DIAGNOSIS — Z7901 Long term (current) use of anticoagulants: Secondary | ICD-10-CM | POA: Diagnosis not present

## 2014-11-22 DIAGNOSIS — K625 Hemorrhage of anus and rectum: Secondary | ICD-10-CM | POA: Diagnosis not present

## 2014-11-22 DIAGNOSIS — Z7901 Long term (current) use of anticoagulants: Secondary | ICD-10-CM | POA: Diagnosis not present

## 2014-11-22 DIAGNOSIS — I2699 Other pulmonary embolism without acute cor pulmonale: Secondary | ICD-10-CM | POA: Diagnosis not present

## 2014-12-07 DIAGNOSIS — Z7901 Long term (current) use of anticoagulants: Secondary | ICD-10-CM | POA: Diagnosis not present

## 2014-12-07 DIAGNOSIS — I2699 Other pulmonary embolism without acute cor pulmonale: Secondary | ICD-10-CM | POA: Diagnosis not present

## 2014-12-20 ENCOUNTER — Encounter: Payer: Self-pay | Admitting: Hematology & Oncology

## 2014-12-20 ENCOUNTER — Other Ambulatory Visit (HOSPITAL_BASED_OUTPATIENT_CLINIC_OR_DEPARTMENT_OTHER): Payer: Medicare Other

## 2014-12-20 ENCOUNTER — Ambulatory Visit (HOSPITAL_BASED_OUTPATIENT_CLINIC_OR_DEPARTMENT_OTHER): Payer: Medicare Other | Admitting: Hematology & Oncology

## 2014-12-20 VITALS — BP 109/44 | HR 52 | Temp 98.1°F | Resp 18 | Ht 62.0 in | Wt 181.0 lb

## 2014-12-20 DIAGNOSIS — M818 Other osteoporosis without current pathological fracture: Secondary | ICD-10-CM | POA: Diagnosis not present

## 2014-12-20 DIAGNOSIS — Z86718 Personal history of other venous thrombosis and embolism: Secondary | ICD-10-CM

## 2014-12-20 DIAGNOSIS — D509 Iron deficiency anemia, unspecified: Secondary | ICD-10-CM | POA: Diagnosis not present

## 2014-12-20 DIAGNOSIS — Z85038 Personal history of other malignant neoplasm of large intestine: Secondary | ICD-10-CM | POA: Diagnosis not present

## 2014-12-20 DIAGNOSIS — Z9189 Other specified personal risk factors, not elsewhere classified: Secondary | ICD-10-CM

## 2014-12-20 DIAGNOSIS — R600 Localized edema: Secondary | ICD-10-CM

## 2014-12-20 DIAGNOSIS — C189 Malignant neoplasm of colon, unspecified: Secondary | ICD-10-CM

## 2014-12-20 DIAGNOSIS — C799 Secondary malignant neoplasm of unspecified site: Secondary | ICD-10-CM

## 2014-12-20 LAB — CBC WITH DIFFERENTIAL (CANCER CENTER ONLY)
BASO#: 0 10*3/uL (ref 0.0–0.2)
BASO%: 0.5 % (ref 0.0–2.0)
EOS ABS: 0.2 10*3/uL (ref 0.0–0.5)
EOS%: 3.4 % (ref 0.0–7.0)
HCT: 38.3 % — ABNORMAL LOW (ref 38.7–49.9)
HEMOGLOBIN: 13 g/dL (ref 13.0–17.1)
LYMPH#: 1.3 10*3/uL (ref 0.9–3.3)
LYMPH%: 20.5 % (ref 14.0–48.0)
MCH: 32.2 pg (ref 28.0–33.4)
MCHC: 33.9 g/dL (ref 32.0–35.9)
MCV: 95 fL (ref 82–98)
MONO#: 0.6 10*3/uL (ref 0.1–0.9)
MONO%: 9 % (ref 0.0–13.0)
NEUT#: 4.1 10*3/uL (ref 1.5–6.5)
NEUT%: 66.6 % (ref 40.0–80.0)
Platelets: 129 10*3/uL — ABNORMAL LOW (ref 145–400)
RBC: 4.04 10*6/uL — AB (ref 4.20–5.70)
RDW: 12.9 % (ref 11.1–15.7)
WBC: 6.1 10*3/uL (ref 4.0–10.0)

## 2014-12-20 LAB — IRON AND TIBC CHCC
%SAT: 21 % (ref 20–55)
IRON: 64 ug/dL (ref 42–163)
TIBC: 305 ug/dL (ref 202–409)
UIBC: 241 ug/dL (ref 117–376)

## 2014-12-20 LAB — COMPREHENSIVE METABOLIC PANEL (CC13)
ALK PHOS: 63 U/L (ref 40–150)
ALT: 13 U/L (ref 0–55)
AST: 17 U/L (ref 5–34)
Albumin: 3.9 g/dL (ref 3.5–5.0)
Anion Gap: 9 mEq/L (ref 3–11)
BUN: 21.7 mg/dL (ref 7.0–26.0)
CALCIUM: 9.2 mg/dL (ref 8.4–10.4)
CHLORIDE: 110 meq/L — AB (ref 98–109)
CO2: 21 mEq/L — ABNORMAL LOW (ref 22–29)
CREATININE: 1 mg/dL (ref 0.7–1.3)
EGFR: 75 mL/min/{1.73_m2} — AB (ref >90)
Glucose: 101 mg/dl (ref 70–140)
Potassium: 4.1 mEq/L (ref 3.5–5.1)
Sodium: 140 mEq/L (ref 136–145)
Total Bilirubin: 0.55 mg/dL (ref 0.20–1.20)
Total Protein: 6.9 g/dL (ref 6.4–8.3)

## 2014-12-20 LAB — FERRITIN CHCC: Ferritin: 39 ng/ml (ref 22–316)
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
## 2014-12-21 LAB — VITAMIN D 25 HYDROXY (VIT D DEFICIENCY, FRACTURES): Vit D, 25-Hydroxy: 47 ng/mL (ref 30–100)

## 2015-01-06 DIAGNOSIS — Z7901 Long term (current) use of anticoagulants: Secondary | ICD-10-CM | POA: Diagnosis not present

## 2015-01-06 DIAGNOSIS — I2699 Other pulmonary embolism without acute cor pulmonale: Secondary | ICD-10-CM | POA: Diagnosis not present

## 2015-01-06 DIAGNOSIS — G4700 Insomnia, unspecified: Secondary | ICD-10-CM | POA: Diagnosis not present

## 2015-01-27 DIAGNOSIS — I2699 Other pulmonary embolism without acute cor pulmonale: Secondary | ICD-10-CM | POA: Diagnosis not present

## 2015-01-27 DIAGNOSIS — R7302 Impaired glucose tolerance (oral): Secondary | ICD-10-CM | POA: Diagnosis not present

## 2015-01-27 DIAGNOSIS — Z7901 Long term (current) use of anticoagulants: Secondary | ICD-10-CM | POA: Diagnosis not present

## 2015-02-24 DIAGNOSIS — Z7901 Long term (current) use of anticoagulants: Secondary | ICD-10-CM | POA: Diagnosis not present

## 2015-02-24 DIAGNOSIS — Z23 Encounter for immunization: Secondary | ICD-10-CM | POA: Diagnosis not present

## 2015-02-24 DIAGNOSIS — I2699 Other pulmonary embolism without acute cor pulmonale: Secondary | ICD-10-CM | POA: Diagnosis not present

## 2015-03-17 DIAGNOSIS — R7302 Impaired glucose tolerance (oral): Secondary | ICD-10-CM | POA: Diagnosis not present

## 2015-03-17 DIAGNOSIS — Z125 Encounter for screening for malignant neoplasm of prostate: Secondary | ICD-10-CM | POA: Diagnosis not present

## 2015-03-17 DIAGNOSIS — I10 Essential (primary) hypertension: Secondary | ICD-10-CM | POA: Diagnosis not present

## 2015-03-17 DIAGNOSIS — E041 Nontoxic single thyroid nodule: Secondary | ICD-10-CM | POA: Diagnosis not present

## 2015-03-17 DIAGNOSIS — E785 Hyperlipidemia, unspecified: Secondary | ICD-10-CM | POA: Diagnosis not present

## 2015-03-24 DIAGNOSIS — D696 Thrombocytopenia, unspecified: Secondary | ICD-10-CM | POA: Diagnosis not present

## 2015-03-24 DIAGNOSIS — Z008 Encounter for other general examination: Secondary | ICD-10-CM | POA: Diagnosis not present

## 2015-03-24 DIAGNOSIS — R5383 Other fatigue: Secondary | ICD-10-CM | POA: Diagnosis not present

## 2015-03-24 DIAGNOSIS — Z6830 Body mass index (BMI) 30.0-30.9, adult: Secondary | ICD-10-CM | POA: Diagnosis not present

## 2015-03-24 DIAGNOSIS — D649 Anemia, unspecified: Secondary | ICD-10-CM | POA: Diagnosis not present

## 2015-03-24 DIAGNOSIS — E785 Hyperlipidemia, unspecified: Secondary | ICD-10-CM | POA: Diagnosis not present

## 2015-03-24 DIAGNOSIS — D692 Other nonthrombocytopenic purpura: Secondary | ICD-10-CM | POA: Diagnosis not present

## 2015-03-24 DIAGNOSIS — I2699 Other pulmonary embolism without acute cor pulmonale: Secondary | ICD-10-CM | POA: Diagnosis not present

## 2015-03-24 DIAGNOSIS — Z0000 Encounter for general adult medical examination without abnormal findings: Secondary | ICD-10-CM | POA: Diagnosis not present

## 2015-03-24 DIAGNOSIS — M5410 Radiculopathy, site unspecified: Secondary | ICD-10-CM | POA: Diagnosis not present

## 2015-03-24 DIAGNOSIS — Z85038 Personal history of other malignant neoplasm of large intestine: Secondary | ICD-10-CM | POA: Diagnosis not present

## 2015-03-24 DIAGNOSIS — N3281 Overactive bladder: Secondary | ICD-10-CM | POA: Diagnosis not present

## 2015-03-24 DIAGNOSIS — R7302 Impaired glucose tolerance (oral): Secondary | ICD-10-CM | POA: Diagnosis not present

## 2015-03-24 DIAGNOSIS — F329 Major depressive disorder, single episode, unspecified: Secondary | ICD-10-CM | POA: Diagnosis not present

## 2015-04-20 DIAGNOSIS — H40013 Open angle with borderline findings, low risk, bilateral: Secondary | ICD-10-CM | POA: Diagnosis not present

## 2015-04-24 DIAGNOSIS — Z7901 Long term (current) use of anticoagulants: Secondary | ICD-10-CM | POA: Diagnosis not present

## 2015-04-24 DIAGNOSIS — I2699 Other pulmonary embolism without acute cor pulmonale: Secondary | ICD-10-CM | POA: Diagnosis not present

## 2015-05-24 DIAGNOSIS — I2699 Other pulmonary embolism without acute cor pulmonale: Secondary | ICD-10-CM | POA: Diagnosis not present

## 2015-05-24 DIAGNOSIS — Z7901 Long term (current) use of anticoagulants: Secondary | ICD-10-CM | POA: Diagnosis not present

## 2015-05-24 DIAGNOSIS — I10 Essential (primary) hypertension: Secondary | ICD-10-CM | POA: Diagnosis not present

## 2015-06-08 DIAGNOSIS — H01002 Unspecified blepharitis right lower eyelid: Secondary | ICD-10-CM | POA: Diagnosis not present

## 2015-06-08 DIAGNOSIS — H01025 Squamous blepharitis left lower eyelid: Secondary | ICD-10-CM | POA: Diagnosis not present

## 2015-06-08 DIAGNOSIS — H43393 Other vitreous opacities, bilateral: Secondary | ICD-10-CM | POA: Diagnosis not present

## 2015-06-08 DIAGNOSIS — H35033 Hypertensive retinopathy, bilateral: Secondary | ICD-10-CM | POA: Diagnosis not present

## 2015-06-08 DIAGNOSIS — H01005 Unspecified blepharitis left lower eyelid: Secondary | ICD-10-CM | POA: Diagnosis not present

## 2015-06-08 DIAGNOSIS — H11823 Conjunctivochalasis, bilateral: Secondary | ICD-10-CM | POA: Diagnosis not present

## 2015-06-08 DIAGNOSIS — H01021 Squamous blepharitis right upper eyelid: Secondary | ICD-10-CM | POA: Diagnosis not present

## 2015-06-08 DIAGNOSIS — H5231 Anisometropia: Secondary | ICD-10-CM | POA: Diagnosis not present

## 2015-06-08 DIAGNOSIS — H25813 Combined forms of age-related cataract, bilateral: Secondary | ICD-10-CM | POA: Diagnosis not present

## 2015-06-08 DIAGNOSIS — H01004 Unspecified blepharitis left upper eyelid: Secondary | ICD-10-CM | POA: Diagnosis not present

## 2015-06-08 DIAGNOSIS — H02836 Dermatochalasis of left eye, unspecified eyelid: Secondary | ICD-10-CM | POA: Diagnosis not present

## 2015-06-08 DIAGNOSIS — H01022 Squamous blepharitis right lower eyelid: Secondary | ICD-10-CM | POA: Diagnosis not present

## 2015-06-08 DIAGNOSIS — H0289 Other specified disorders of eyelid: Secondary | ICD-10-CM | POA: Diagnosis not present

## 2015-06-08 DIAGNOSIS — H01024 Squamous blepharitis left upper eyelid: Secondary | ICD-10-CM | POA: Diagnosis not present

## 2015-06-08 DIAGNOSIS — H02833 Dermatochalasis of right eye, unspecified eyelid: Secondary | ICD-10-CM | POA: Diagnosis not present

## 2015-06-21 DIAGNOSIS — Z7901 Long term (current) use of anticoagulants: Secondary | ICD-10-CM | POA: Diagnosis not present

## 2015-06-21 DIAGNOSIS — I2699 Other pulmonary embolism without acute cor pulmonale: Secondary | ICD-10-CM | POA: Diagnosis not present

## 2015-07-19 DIAGNOSIS — Z7901 Long term (current) use of anticoagulants: Secondary | ICD-10-CM | POA: Diagnosis not present

## 2015-07-19 DIAGNOSIS — I2699 Other pulmonary embolism without acute cor pulmonale: Secondary | ICD-10-CM | POA: Diagnosis not present

## 2015-07-20 DIAGNOSIS — Z9889 Other specified postprocedural states: Secondary | ICD-10-CM | POA: Diagnosis not present

## 2015-07-20 DIAGNOSIS — H25811 Combined forms of age-related cataract, right eye: Secondary | ICD-10-CM | POA: Diagnosis not present

## 2015-07-20 DIAGNOSIS — Z7901 Long term (current) use of anticoagulants: Secondary | ICD-10-CM | POA: Diagnosis not present

## 2015-07-20 DIAGNOSIS — Z7982 Long term (current) use of aspirin: Secondary | ICD-10-CM | POA: Diagnosis not present

## 2015-07-20 DIAGNOSIS — Z885 Allergy status to narcotic agent status: Secondary | ICD-10-CM | POA: Diagnosis not present

## 2015-07-20 DIAGNOSIS — G4730 Sleep apnea, unspecified: Secondary | ICD-10-CM | POA: Diagnosis not present

## 2015-07-20 DIAGNOSIS — Z79899 Other long term (current) drug therapy: Secondary | ICD-10-CM | POA: Diagnosis not present

## 2015-07-20 DIAGNOSIS — Z888 Allergy status to other drugs, medicaments and biological substances status: Secondary | ICD-10-CM | POA: Diagnosis not present

## 2015-07-20 DIAGNOSIS — I10 Essential (primary) hypertension: Secondary | ICD-10-CM | POA: Diagnosis not present

## 2015-07-31 DIAGNOSIS — Z85038 Personal history of other malignant neoplasm of large intestine: Secondary | ICD-10-CM | POA: Diagnosis not present

## 2015-07-31 DIAGNOSIS — H25811 Combined forms of age-related cataract, right eye: Secondary | ICD-10-CM | POA: Diagnosis not present

## 2015-07-31 DIAGNOSIS — Z933 Colostomy status: Secondary | ICD-10-CM | POA: Diagnosis not present

## 2015-07-31 DIAGNOSIS — Z7901 Long term (current) use of anticoagulants: Secondary | ICD-10-CM | POA: Diagnosis not present

## 2015-07-31 DIAGNOSIS — Z86718 Personal history of other venous thrombosis and embolism: Secondary | ICD-10-CM | POA: Diagnosis not present

## 2015-07-31 DIAGNOSIS — Z86711 Personal history of pulmonary embolism: Secondary | ICD-10-CM | POA: Diagnosis not present

## 2015-07-31 DIAGNOSIS — Z8719 Personal history of other diseases of the digestive system: Secondary | ICD-10-CM | POA: Diagnosis not present

## 2015-07-31 DIAGNOSIS — Z7982 Long term (current) use of aspirin: Secondary | ICD-10-CM | POA: Diagnosis not present

## 2015-07-31 DIAGNOSIS — G4730 Sleep apnea, unspecified: Secondary | ICD-10-CM | POA: Diagnosis not present

## 2015-07-31 DIAGNOSIS — I10 Essential (primary) hypertension: Secondary | ICD-10-CM | POA: Diagnosis not present

## 2015-08-01 DIAGNOSIS — Z48810 Encounter for surgical aftercare following surgery on the sense organs: Secondary | ICD-10-CM | POA: Diagnosis not present

## 2015-08-01 DIAGNOSIS — M329 Systemic lupus erythematosus, unspecified: Secondary | ICD-10-CM | POA: Diagnosis not present

## 2015-08-01 DIAGNOSIS — Z961 Presence of intraocular lens: Secondary | ICD-10-CM | POA: Diagnosis not present

## 2015-08-16 DIAGNOSIS — Z7901 Long term (current) use of anticoagulants: Secondary | ICD-10-CM | POA: Diagnosis not present

## 2015-08-16 DIAGNOSIS — I2699 Other pulmonary embolism without acute cor pulmonale: Secondary | ICD-10-CM | POA: Diagnosis not present

## 2015-08-30 DIAGNOSIS — Z7901 Long term (current) use of anticoagulants: Secondary | ICD-10-CM | POA: Diagnosis not present

## 2015-08-30 DIAGNOSIS — I2699 Other pulmonary embolism without acute cor pulmonale: Secondary | ICD-10-CM | POA: Diagnosis not present

## 2015-09-05 DIAGNOSIS — Z961 Presence of intraocular lens: Secondary | ICD-10-CM | POA: Diagnosis not present

## 2015-09-05 DIAGNOSIS — Z86711 Personal history of pulmonary embolism: Secondary | ICD-10-CM | POA: Diagnosis not present

## 2015-09-05 DIAGNOSIS — Z7982 Long term (current) use of aspirin: Secondary | ICD-10-CM | POA: Diagnosis not present

## 2015-09-05 DIAGNOSIS — Z7901 Long term (current) use of anticoagulants: Secondary | ICD-10-CM | POA: Diagnosis not present

## 2015-09-05 DIAGNOSIS — H25812 Combined forms of age-related cataract, left eye: Secondary | ICD-10-CM | POA: Diagnosis not present

## 2015-09-11 DIAGNOSIS — I10 Essential (primary) hypertension: Secondary | ICD-10-CM | POA: Diagnosis not present

## 2015-09-11 DIAGNOSIS — H25812 Combined forms of age-related cataract, left eye: Secondary | ICD-10-CM | POA: Diagnosis not present

## 2015-09-12 DIAGNOSIS — Z961 Presence of intraocular lens: Secondary | ICD-10-CM | POA: Diagnosis not present

## 2015-09-12 DIAGNOSIS — Z48810 Encounter for surgical aftercare following surgery on the sense organs: Secondary | ICD-10-CM | POA: Diagnosis not present

## 2015-09-12 DIAGNOSIS — M329 Systemic lupus erythematosus, unspecified: Secondary | ICD-10-CM | POA: Diagnosis not present

## 2015-09-19 DIAGNOSIS — Z48810 Encounter for surgical aftercare following surgery on the sense organs: Secondary | ICD-10-CM | POA: Diagnosis not present

## 2015-09-19 DIAGNOSIS — Z961 Presence of intraocular lens: Secondary | ICD-10-CM | POA: Diagnosis not present

## 2015-09-21 DIAGNOSIS — D696 Thrombocytopenia, unspecified: Secondary | ICD-10-CM | POA: Diagnosis not present

## 2015-09-21 DIAGNOSIS — D692 Other nonthrombocytopenic purpura: Secondary | ICD-10-CM | POA: Diagnosis not present

## 2015-09-21 DIAGNOSIS — R7302 Impaired glucose tolerance (oral): Secondary | ICD-10-CM | POA: Diagnosis not present

## 2015-09-21 DIAGNOSIS — M5410 Radiculopathy, site unspecified: Secondary | ICD-10-CM | POA: Diagnosis not present

## 2015-09-21 DIAGNOSIS — I10 Essential (primary) hypertension: Secondary | ICD-10-CM | POA: Diagnosis not present

## 2015-09-21 DIAGNOSIS — Z85038 Personal history of other malignant neoplasm of large intestine: Secondary | ICD-10-CM | POA: Diagnosis not present

## 2015-09-21 DIAGNOSIS — Z7901 Long term (current) use of anticoagulants: Secondary | ICD-10-CM | POA: Diagnosis not present

## 2015-09-21 DIAGNOSIS — G4733 Obstructive sleep apnea (adult) (pediatric): Secondary | ICD-10-CM | POA: Diagnosis not present

## 2015-09-21 DIAGNOSIS — Z6831 Body mass index (BMI) 31.0-31.9, adult: Secondary | ICD-10-CM | POA: Diagnosis not present

## 2015-09-21 DIAGNOSIS — R600 Localized edema: Secondary | ICD-10-CM | POA: Diagnosis not present

## 2015-10-18 DIAGNOSIS — Z7901 Long term (current) use of anticoagulants: Secondary | ICD-10-CM | POA: Diagnosis not present

## 2015-10-18 DIAGNOSIS — I2699 Other pulmonary embolism without acute cor pulmonale: Secondary | ICD-10-CM | POA: Diagnosis not present

## 2015-10-20 DIAGNOSIS — R42 Dizziness and giddiness: Secondary | ICD-10-CM | POA: Diagnosis not present

## 2015-10-20 DIAGNOSIS — I10 Essential (primary) hypertension: Secondary | ICD-10-CM | POA: Diagnosis not present

## 2015-10-20 DIAGNOSIS — R600 Localized edema: Secondary | ICD-10-CM | POA: Diagnosis not present

## 2015-10-20 DIAGNOSIS — Z6831 Body mass index (BMI) 31.0-31.9, adult: Secondary | ICD-10-CM | POA: Diagnosis not present

## 2015-11-21 DIAGNOSIS — I2699 Other pulmonary embolism without acute cor pulmonale: Secondary | ICD-10-CM | POA: Diagnosis not present

## 2015-11-21 DIAGNOSIS — Z7901 Long term (current) use of anticoagulants: Secondary | ICD-10-CM | POA: Diagnosis not present

## 2015-12-20 ENCOUNTER — Other Ambulatory Visit (HOSPITAL_BASED_OUTPATIENT_CLINIC_OR_DEPARTMENT_OTHER): Payer: Medicare Other

## 2015-12-20 ENCOUNTER — Ambulatory Visit (HOSPITAL_BASED_OUTPATIENT_CLINIC_OR_DEPARTMENT_OTHER): Payer: Medicare Other | Admitting: Hematology & Oncology

## 2015-12-20 ENCOUNTER — Encounter: Payer: Self-pay | Admitting: Hematology & Oncology

## 2015-12-20 VITALS — BP 133/62 | HR 53 | Temp 98.0°F | Resp 16 | Ht 62.0 in | Wt 185.0 lb

## 2015-12-20 DIAGNOSIS — Z9189 Other specified personal risk factors, not elsewhere classified: Secondary | ICD-10-CM | POA: Diagnosis not present

## 2015-12-20 DIAGNOSIS — I82401 Acute embolism and thrombosis of unspecified deep veins of right lower extremity: Secondary | ICD-10-CM | POA: Diagnosis not present

## 2015-12-20 DIAGNOSIS — Z85038 Personal history of other malignant neoplasm of large intestine: Secondary | ICD-10-CM

## 2015-12-20 DIAGNOSIS — C189 Malignant neoplasm of colon, unspecified: Secondary | ICD-10-CM

## 2015-12-20 DIAGNOSIS — C799 Secondary malignant neoplasm of unspecified site: Secondary | ICD-10-CM | POA: Diagnosis not present

## 2015-12-20 DIAGNOSIS — C187 Malignant neoplasm of sigmoid colon: Secondary | ICD-10-CM

## 2015-12-20 LAB — CBC WITH DIFFERENTIAL (CANCER CENTER ONLY)
BASO#: 0 10*3/uL (ref 0.0–0.2)
BASO%: 0.4 % (ref 0.0–2.0)
EOS%: 2.4 % (ref 0.0–7.0)
Eosinophils Absolute: 0.1 10*3/uL (ref 0.0–0.5)
HCT: 41.2 % (ref 38.7–49.9)
HGB: 14.1 g/dL (ref 13.0–17.1)
LYMPH#: 1.6 10*3/uL (ref 0.9–3.3)
LYMPH%: 29.7 % (ref 14.0–48.0)
MCH: 31.8 pg (ref 28.0–33.4)
MCHC: 34.2 g/dL (ref 32.0–35.9)
MCV: 93 fL (ref 82–98)
MONO#: 0.6 10*3/uL (ref 0.1–0.9)
MONO%: 11.7 % (ref 0.0–13.0)
NEUT%: 55.8 % (ref 40.0–80.0)
NEUTROS ABS: 3 10*3/uL (ref 1.5–6.5)
PLATELETS: 113 10*3/uL — AB (ref 145–400)
RBC: 4.44 10*6/uL (ref 4.20–5.70)
RDW: 12.8 % (ref 11.1–15.7)
WBC: 5.3 10*3/uL (ref 4.0–10.0)

## 2015-12-20 LAB — COMPREHENSIVE METABOLIC PANEL
ALT: 13 U/L (ref 0–55)
AST: 18 U/L (ref 5–34)
Albumin: 3.8 g/dL (ref 3.5–5.0)
Alkaline Phosphatase: 70 U/L (ref 40–150)
Anion Gap: 10 mEq/L (ref 3–11)
BILIRUBIN TOTAL: 0.73 mg/dL (ref 0.20–1.20)
BUN: 15.2 mg/dL (ref 7.0–26.0)
CO2: 22 mEq/L (ref 22–29)
Calcium: 9.3 mg/dL (ref 8.4–10.4)
Chloride: 110 mEq/L — ABNORMAL HIGH (ref 98–109)
Creatinine: 1 mg/dL (ref 0.7–1.3)
EGFR: 70 mL/min/{1.73_m2} — AB (ref >90)
Glucose: 89 mg/dl (ref 70–140)
Potassium: 4.1 mEq/L (ref 3.5–5.1)
SODIUM: 141 meq/L (ref 136–145)
TOTAL PROTEIN: 7.2 g/dL (ref 6.4–8.3)
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
## 2015-12-21 LAB — CEA: CEA1: 1.3 ng/mL (ref 0.0–4.7)

## 2015-12-25 DIAGNOSIS — I2699 Other pulmonary embolism without acute cor pulmonale: Secondary | ICD-10-CM | POA: Diagnosis not present

## 2015-12-25 DIAGNOSIS — Z7901 Long term (current) use of anticoagulants: Secondary | ICD-10-CM | POA: Diagnosis not present

## 2015-12-25 DIAGNOSIS — M79604 Pain in right leg: Secondary | ICD-10-CM | POA: Diagnosis not present

## 2015-12-25 DIAGNOSIS — I82819 Embolism and thrombosis of superficial veins of unspecified lower extremities: Secondary | ICD-10-CM | POA: Diagnosis not present

## 2015-12-29 DIAGNOSIS — R202 Paresthesia of skin: Secondary | ICD-10-CM | POA: Diagnosis not present

## 2015-12-29 DIAGNOSIS — M25561 Pain in right knee: Secondary | ICD-10-CM | POA: Diagnosis not present

## 2015-12-29 DIAGNOSIS — R200 Anesthesia of skin: Secondary | ICD-10-CM | POA: Diagnosis not present

## 2015-12-29 DIAGNOSIS — Z85038 Personal history of other malignant neoplasm of large intestine: Secondary | ICD-10-CM | POA: Diagnosis not present

## 2015-12-29 DIAGNOSIS — Z86718 Personal history of other venous thrombosis and embolism: Secondary | ICD-10-CM | POA: Diagnosis not present

## 2015-12-29 DIAGNOSIS — Z7901 Long term (current) use of anticoagulants: Secondary | ICD-10-CM | POA: Diagnosis not present

## 2015-12-29 DIAGNOSIS — M5416 Radiculopathy, lumbar region: Secondary | ICD-10-CM | POA: Diagnosis not present

## 2016-01-04 DIAGNOSIS — M25551 Pain in right hip: Secondary | ICD-10-CM | POA: Diagnosis not present

## 2016-01-04 DIAGNOSIS — R531 Weakness: Secondary | ICD-10-CM | POA: Diagnosis not present

## 2016-01-04 DIAGNOSIS — M79662 Pain in left lower leg: Secondary | ICD-10-CM | POA: Diagnosis not present

## 2016-01-04 DIAGNOSIS — M25552 Pain in left hip: Secondary | ICD-10-CM | POA: Diagnosis not present

## 2016-01-16 DIAGNOSIS — Z86718 Personal history of other venous thrombosis and embolism: Secondary | ICD-10-CM | POA: Diagnosis not present

## 2016-01-16 DIAGNOSIS — R200 Anesthesia of skin: Secondary | ICD-10-CM | POA: Diagnosis not present

## 2016-01-16 DIAGNOSIS — Z86711 Personal history of pulmonary embolism: Secondary | ICD-10-CM | POA: Diagnosis not present

## 2016-01-16 DIAGNOSIS — R29898 Other symptoms and signs involving the musculoskeletal system: Secondary | ICD-10-CM | POA: Diagnosis not present

## 2016-01-16 DIAGNOSIS — Z7982 Long term (current) use of aspirin: Secondary | ICD-10-CM | POA: Diagnosis not present

## 2016-01-16 DIAGNOSIS — M79605 Pain in left leg: Secondary | ICD-10-CM | POA: Diagnosis not present

## 2016-01-16 DIAGNOSIS — Z7901 Long term (current) use of anticoagulants: Secondary | ICD-10-CM | POA: Diagnosis not present

## 2016-01-30 DIAGNOSIS — I2699 Other pulmonary embolism without acute cor pulmonale: Secondary | ICD-10-CM | POA: Diagnosis not present

## 2016-01-30 DIAGNOSIS — Z7901 Long term (current) use of anticoagulants: Secondary | ICD-10-CM | POA: Diagnosis not present

## 2016-02-27 DIAGNOSIS — L989 Disorder of the skin and subcutaneous tissue, unspecified: Secondary | ICD-10-CM | POA: Diagnosis not present

## 2016-02-27 DIAGNOSIS — I2699 Other pulmonary embolism without acute cor pulmonale: Secondary | ICD-10-CM | POA: Diagnosis not present

## 2016-02-27 DIAGNOSIS — Z7901 Long term (current) use of anticoagulants: Secondary | ICD-10-CM | POA: Diagnosis not present

## 2016-03-11 DIAGNOSIS — D1801 Hemangioma of skin and subcutaneous tissue: Secondary | ICD-10-CM | POA: Diagnosis not present

## 2016-03-11 DIAGNOSIS — D485 Neoplasm of uncertain behavior of skin: Secondary | ICD-10-CM | POA: Diagnosis not present

## 2016-03-11 DIAGNOSIS — L821 Other seborrheic keratosis: Secondary | ICD-10-CM | POA: Diagnosis not present

## 2016-03-11 DIAGNOSIS — C44519 Basal cell carcinoma of skin of other part of trunk: Secondary | ICD-10-CM | POA: Diagnosis not present

## 2016-03-11 DIAGNOSIS — D692 Other nonthrombocytopenic purpura: Secondary | ICD-10-CM | POA: Diagnosis not present

## 2016-03-22 DIAGNOSIS — Z125 Encounter for screening for malignant neoplasm of prostate: Secondary | ICD-10-CM | POA: Diagnosis not present

## 2016-03-22 DIAGNOSIS — N390 Urinary tract infection, site not specified: Secondary | ICD-10-CM | POA: Diagnosis not present

## 2016-03-22 DIAGNOSIS — R7302 Impaired glucose tolerance (oral): Secondary | ICD-10-CM | POA: Diagnosis not present

## 2016-03-22 DIAGNOSIS — Z85038 Personal history of other malignant neoplasm of large intestine: Secondary | ICD-10-CM | POA: Diagnosis not present

## 2016-03-22 DIAGNOSIS — E041 Nontoxic single thyroid nodule: Secondary | ICD-10-CM | POA: Diagnosis not present

## 2016-03-22 DIAGNOSIS — E784 Other hyperlipidemia: Secondary | ICD-10-CM | POA: Diagnosis not present

## 2016-03-22 DIAGNOSIS — R8299 Other abnormal findings in urine: Secondary | ICD-10-CM | POA: Diagnosis not present

## 2016-03-22 DIAGNOSIS — R591 Generalized enlarged lymph nodes: Secondary | ICD-10-CM | POA: Diagnosis not present

## 2016-03-22 DIAGNOSIS — I10 Essential (primary) hypertension: Secondary | ICD-10-CM | POA: Diagnosis not present

## 2016-03-29 DIAGNOSIS — Z23 Encounter for immunization: Secondary | ICD-10-CM | POA: Diagnosis not present

## 2016-03-29 DIAGNOSIS — Z85038 Personal history of other malignant neoplasm of large intestine: Secondary | ICD-10-CM | POA: Diagnosis not present

## 2016-03-29 DIAGNOSIS — R5383 Other fatigue: Secondary | ICD-10-CM | POA: Diagnosis not present

## 2016-03-29 DIAGNOSIS — D698 Other specified hemorrhagic conditions: Secondary | ICD-10-CM | POA: Diagnosis not present

## 2016-03-29 DIAGNOSIS — R7302 Impaired glucose tolerance (oral): Secondary | ICD-10-CM | POA: Diagnosis not present

## 2016-03-29 DIAGNOSIS — Z6833 Body mass index (BMI) 33.0-33.9, adult: Secondary | ICD-10-CM | POA: Diagnosis not present

## 2016-03-29 DIAGNOSIS — Z933 Colostomy status: Secondary | ICD-10-CM | POA: Diagnosis not present

## 2016-03-29 DIAGNOSIS — D6489 Other specified anemias: Secondary | ICD-10-CM | POA: Diagnosis not present

## 2016-03-29 DIAGNOSIS — R600 Localized edema: Secondary | ICD-10-CM | POA: Diagnosis not present

## 2016-03-29 DIAGNOSIS — D692 Other nonthrombocytopenic purpura: Secondary | ICD-10-CM | POA: Diagnosis not present

## 2016-03-29 DIAGNOSIS — Z0000 Encounter for general adult medical examination without abnormal findings: Secondary | ICD-10-CM | POA: Diagnosis not present

## 2016-03-29 DIAGNOSIS — Z1389 Encounter for screening for other disorder: Secondary | ICD-10-CM | POA: Diagnosis not present

## 2016-04-29 DIAGNOSIS — Z7901 Long term (current) use of anticoagulants: Secondary | ICD-10-CM | POA: Diagnosis not present

## 2016-04-29 DIAGNOSIS — Z86711 Personal history of pulmonary embolism: Secondary | ICD-10-CM | POA: Diagnosis not present

## 2016-05-27 DIAGNOSIS — Z7901 Long term (current) use of anticoagulants: Secondary | ICD-10-CM | POA: Diagnosis not present

## 2016-05-27 DIAGNOSIS — Z86711 Personal history of pulmonary embolism: Secondary | ICD-10-CM | POA: Diagnosis not present

## 2016-05-27 DIAGNOSIS — R0609 Other forms of dyspnea: Secondary | ICD-10-CM | POA: Diagnosis not present

## 2016-05-29 DIAGNOSIS — H02834 Dermatochalasis of left upper eyelid: Secondary | ICD-10-CM | POA: Diagnosis not present

## 2016-05-29 DIAGNOSIS — I10 Essential (primary) hypertension: Secondary | ICD-10-CM | POA: Diagnosis not present

## 2016-05-29 DIAGNOSIS — H01025 Squamous blepharitis left lower eyelid: Secondary | ICD-10-CM | POA: Diagnosis not present

## 2016-05-29 DIAGNOSIS — Z86718 Personal history of other venous thrombosis and embolism: Secondary | ICD-10-CM | POA: Diagnosis not present

## 2016-05-29 DIAGNOSIS — Z79899 Other long term (current) drug therapy: Secondary | ICD-10-CM | POA: Diagnosis not present

## 2016-05-29 DIAGNOSIS — H01022 Squamous blepharitis right lower eyelid: Secondary | ICD-10-CM | POA: Diagnosis not present

## 2016-05-29 DIAGNOSIS — H01024 Squamous blepharitis left upper eyelid: Secondary | ICD-10-CM | POA: Diagnosis not present

## 2016-05-29 DIAGNOSIS — H02835 Dermatochalasis of left lower eyelid: Secondary | ICD-10-CM | POA: Diagnosis not present

## 2016-05-29 DIAGNOSIS — H409 Unspecified glaucoma: Secondary | ICD-10-CM | POA: Diagnosis not present

## 2016-05-29 DIAGNOSIS — Z7901 Long term (current) use of anticoagulants: Secondary | ICD-10-CM | POA: Diagnosis not present

## 2016-05-29 DIAGNOSIS — H02832 Dermatochalasis of right lower eyelid: Secondary | ICD-10-CM | POA: Diagnosis not present

## 2016-05-29 DIAGNOSIS — Z9841 Cataract extraction status, right eye: Secondary | ICD-10-CM | POA: Diagnosis not present

## 2016-05-29 DIAGNOSIS — H01021 Squamous blepharitis right upper eyelid: Secondary | ICD-10-CM | POA: Diagnosis not present

## 2016-05-29 DIAGNOSIS — H43393 Other vitreous opacities, bilateral: Secondary | ICD-10-CM | POA: Diagnosis not present

## 2016-05-29 DIAGNOSIS — Z7982 Long term (current) use of aspirin: Secondary | ICD-10-CM | POA: Diagnosis not present

## 2016-05-29 DIAGNOSIS — H11823 Conjunctivochalasis, bilateral: Secondary | ICD-10-CM | POA: Diagnosis not present

## 2016-05-29 DIAGNOSIS — Z885 Allergy status to narcotic agent status: Secondary | ICD-10-CM | POA: Diagnosis not present

## 2016-05-29 DIAGNOSIS — H0289 Other specified disorders of eyelid: Secondary | ICD-10-CM | POA: Diagnosis not present

## 2016-05-29 DIAGNOSIS — H35033 Hypertensive retinopathy, bilateral: Secondary | ICD-10-CM | POA: Diagnosis not present

## 2016-05-29 DIAGNOSIS — Z86711 Personal history of pulmonary embolism: Secondary | ICD-10-CM | POA: Diagnosis not present

## 2016-05-29 DIAGNOSIS — Z9842 Cataract extraction status, left eye: Secondary | ICD-10-CM | POA: Diagnosis not present

## 2016-05-29 DIAGNOSIS — H02831 Dermatochalasis of right upper eyelid: Secondary | ICD-10-CM | POA: Diagnosis not present

## 2016-05-29 DIAGNOSIS — Z961 Presence of intraocular lens: Secondary | ICD-10-CM | POA: Diagnosis not present

## 2016-05-29 DIAGNOSIS — H40003 Preglaucoma, unspecified, bilateral: Secondary | ICD-10-CM | POA: Diagnosis not present

## 2016-05-29 DIAGNOSIS — Z888 Allergy status to other drugs, medicaments and biological substances status: Secondary | ICD-10-CM | POA: Diagnosis not present

## 2016-06-28 DIAGNOSIS — Z86711 Personal history of pulmonary embolism: Secondary | ICD-10-CM | POA: Diagnosis not present

## 2016-06-28 DIAGNOSIS — Z7901 Long term (current) use of anticoagulants: Secondary | ICD-10-CM | POA: Diagnosis not present

## 2016-07-15 DIAGNOSIS — R5383 Other fatigue: Secondary | ICD-10-CM | POA: Diagnosis not present

## 2016-07-15 DIAGNOSIS — I491 Atrial premature depolarization: Secondary | ICD-10-CM | POA: Diagnosis not present

## 2016-07-15 DIAGNOSIS — R0609 Other forms of dyspnea: Secondary | ICD-10-CM | POA: Diagnosis not present

## 2016-07-15 DIAGNOSIS — R001 Bradycardia, unspecified: Secondary | ICD-10-CM | POA: Diagnosis not present

## 2016-07-15 DIAGNOSIS — G4733 Obstructive sleep apnea (adult) (pediatric): Secondary | ICD-10-CM | POA: Diagnosis not present

## 2016-08-14 DIAGNOSIS — I517 Cardiomegaly: Secondary | ICD-10-CM | POA: Diagnosis not present

## 2016-08-14 DIAGNOSIS — I081 Rheumatic disorders of both mitral and tricuspid valves: Secondary | ICD-10-CM | POA: Diagnosis not present

## 2016-08-27 DIAGNOSIS — Z86711 Personal history of pulmonary embolism: Secondary | ICD-10-CM | POA: Diagnosis not present

## 2016-08-27 DIAGNOSIS — Z7901 Long term (current) use of anticoagulants: Secondary | ICD-10-CM | POA: Diagnosis not present

## 2016-09-17 DIAGNOSIS — Z9989 Dependence on other enabling machines and devices: Secondary | ICD-10-CM | POA: Diagnosis not present

## 2016-09-17 DIAGNOSIS — Z960 Presence of urogenital implants: Secondary | ICD-10-CM | POA: Diagnosis not present

## 2016-09-17 DIAGNOSIS — R1032 Left lower quadrant pain: Secondary | ICD-10-CM | POA: Diagnosis not present

## 2016-09-17 DIAGNOSIS — Z888 Allergy status to other drugs, medicaments and biological substances status: Secondary | ICD-10-CM | POA: Diagnosis not present

## 2016-09-17 DIAGNOSIS — G4733 Obstructive sleep apnea (adult) (pediatric): Secondary | ICD-10-CM | POA: Diagnosis not present

## 2016-09-17 DIAGNOSIS — I878 Other specified disorders of veins: Secondary | ICD-10-CM | POA: Diagnosis not present

## 2016-09-17 DIAGNOSIS — N179 Acute kidney failure, unspecified: Secondary | ICD-10-CM | POA: Diagnosis not present

## 2016-09-17 DIAGNOSIS — K219 Gastro-esophageal reflux disease without esophagitis: Secondary | ICD-10-CM | POA: Diagnosis not present

## 2016-09-17 DIAGNOSIS — R109 Unspecified abdominal pain: Secondary | ICD-10-CM | POA: Diagnosis not present

## 2016-09-17 DIAGNOSIS — K8020 Calculus of gallbladder without cholecystitis without obstruction: Secondary | ICD-10-CM | POA: Diagnosis not present

## 2016-09-17 DIAGNOSIS — R011 Cardiac murmur, unspecified: Secondary | ICD-10-CM | POA: Diagnosis not present

## 2016-09-17 DIAGNOSIS — D696 Thrombocytopenia, unspecified: Secondary | ICD-10-CM | POA: Diagnosis not present

## 2016-09-17 DIAGNOSIS — Z85038 Personal history of other malignant neoplasm of large intestine: Secondary | ICD-10-CM | POA: Diagnosis not present

## 2016-09-17 DIAGNOSIS — R1084 Generalized abdominal pain: Secondary | ICD-10-CM | POA: Diagnosis not present

## 2016-09-17 DIAGNOSIS — R310 Gross hematuria: Secondary | ICD-10-CM | POA: Diagnosis not present

## 2016-09-17 DIAGNOSIS — Z933 Colostomy status: Secondary | ICD-10-CM | POA: Diagnosis not present

## 2016-09-17 DIAGNOSIS — M25511 Pain in right shoulder: Secondary | ICD-10-CM | POA: Diagnosis present

## 2016-09-17 DIAGNOSIS — N132 Hydronephrosis with renal and ureteral calculous obstruction: Secondary | ICD-10-CM | POA: Diagnosis not present

## 2016-09-17 DIAGNOSIS — Z7901 Long term (current) use of anticoagulants: Secondary | ICD-10-CM | POA: Diagnosis not present

## 2016-09-17 DIAGNOSIS — Z934 Other artificial openings of gastrointestinal tract status: Secondary | ICD-10-CM | POA: Diagnosis not present

## 2016-09-17 DIAGNOSIS — E669 Obesity, unspecified: Secondary | ICD-10-CM | POA: Diagnosis present

## 2016-09-17 DIAGNOSIS — Z86711 Personal history of pulmonary embolism: Secondary | ICD-10-CM | POA: Diagnosis not present

## 2016-09-17 DIAGNOSIS — Z7982 Long term (current) use of aspirin: Secondary | ICD-10-CM | POA: Diagnosis not present

## 2016-09-17 DIAGNOSIS — K435 Parastomal hernia without obstruction or  gangrene: Secondary | ICD-10-CM | POA: Diagnosis not present

## 2016-09-17 DIAGNOSIS — N400 Enlarged prostate without lower urinary tract symptoms: Secondary | ICD-10-CM | POA: Diagnosis not present

## 2016-09-17 DIAGNOSIS — Z466 Encounter for fitting and adjustment of urinary device: Secondary | ICD-10-CM | POA: Diagnosis not present

## 2016-09-17 DIAGNOSIS — N1330 Unspecified hydronephrosis: Secondary | ICD-10-CM | POA: Diagnosis not present

## 2016-09-17 DIAGNOSIS — Z86718 Personal history of other venous thrombosis and embolism: Secondary | ICD-10-CM | POA: Diagnosis not present

## 2016-09-17 DIAGNOSIS — I700 Atherosclerosis of aorta: Secondary | ICD-10-CM | POA: Diagnosis not present

## 2016-09-17 DIAGNOSIS — Z6836 Body mass index (BMI) 36.0-36.9, adult: Secondary | ICD-10-CM | POA: Diagnosis not present

## 2016-09-17 DIAGNOSIS — Z885 Allergy status to narcotic agent status: Secondary | ICD-10-CM | POA: Diagnosis not present

## 2016-09-27 DIAGNOSIS — Z86711 Personal history of pulmonary embolism: Secondary | ICD-10-CM | POA: Diagnosis not present

## 2016-09-27 DIAGNOSIS — I10 Essential (primary) hypertension: Secondary | ICD-10-CM | POA: Diagnosis not present

## 2016-09-27 DIAGNOSIS — R3129 Other microscopic hematuria: Secondary | ICD-10-CM | POA: Diagnosis not present

## 2016-09-27 DIAGNOSIS — Z933 Colostomy status: Secondary | ICD-10-CM | POA: Diagnosis not present

## 2016-09-27 DIAGNOSIS — R600 Localized edema: Secondary | ICD-10-CM | POA: Diagnosis not present

## 2016-09-27 DIAGNOSIS — R7302 Impaired glucose tolerance (oral): Secondary | ICD-10-CM | POA: Diagnosis not present

## 2016-09-27 DIAGNOSIS — D649 Anemia, unspecified: Secondary | ICD-10-CM | POA: Diagnosis not present

## 2016-10-04 DIAGNOSIS — Z888 Allergy status to other drugs, medicaments and biological substances status: Secondary | ICD-10-CM | POA: Diagnosis not present

## 2016-10-04 DIAGNOSIS — G4730 Sleep apnea, unspecified: Secondary | ICD-10-CM | POA: Diagnosis not present

## 2016-10-04 DIAGNOSIS — Z7982 Long term (current) use of aspirin: Secondary | ICD-10-CM | POA: Diagnosis not present

## 2016-10-04 DIAGNOSIS — E785 Hyperlipidemia, unspecified: Secondary | ICD-10-CM | POA: Diagnosis not present

## 2016-10-04 DIAGNOSIS — R310 Gross hematuria: Secondary | ICD-10-CM | POA: Diagnosis not present

## 2016-10-04 DIAGNOSIS — Z9989 Dependence on other enabling machines and devices: Secondary | ICD-10-CM | POA: Diagnosis not present

## 2016-10-04 DIAGNOSIS — Z85038 Personal history of other malignant neoplasm of large intestine: Secondary | ICD-10-CM | POA: Diagnosis not present

## 2016-10-04 DIAGNOSIS — Z933 Colostomy status: Secondary | ICD-10-CM | POA: Diagnosis not present

## 2016-10-04 DIAGNOSIS — Z86718 Personal history of other venous thrombosis and embolism: Secondary | ICD-10-CM | POA: Diagnosis not present

## 2016-10-04 DIAGNOSIS — Z79899 Other long term (current) drug therapy: Secondary | ICD-10-CM | POA: Diagnosis not present

## 2016-10-04 DIAGNOSIS — N131 Hydronephrosis with ureteral stricture, not elsewhere classified: Secondary | ICD-10-CM | POA: Diagnosis not present

## 2016-10-04 DIAGNOSIS — Z885 Allergy status to narcotic agent status: Secondary | ICD-10-CM | POA: Diagnosis not present

## 2016-10-04 DIAGNOSIS — N135 Crossing vessel and stricture of ureter without hydronephrosis: Secondary | ICD-10-CM | POA: Diagnosis not present

## 2016-10-04 DIAGNOSIS — N2889 Other specified disorders of kidney and ureter: Secondary | ICD-10-CM | POA: Diagnosis not present

## 2016-10-04 DIAGNOSIS — G4733 Obstructive sleep apnea (adult) (pediatric): Secondary | ICD-10-CM | POA: Diagnosis not present

## 2016-10-04 DIAGNOSIS — E669 Obesity, unspecified: Secondary | ICD-10-CM | POA: Diagnosis not present

## 2016-10-04 DIAGNOSIS — R319 Hematuria, unspecified: Secondary | ICD-10-CM | POA: Diagnosis not present

## 2016-10-04 DIAGNOSIS — K219 Gastro-esophageal reflux disease without esophagitis: Secondary | ICD-10-CM | POA: Diagnosis not present

## 2016-10-04 DIAGNOSIS — Z7901 Long term (current) use of anticoagulants: Secondary | ICD-10-CM | POA: Diagnosis not present

## 2016-10-04 DIAGNOSIS — Z86711 Personal history of pulmonary embolism: Secondary | ICD-10-CM | POA: Diagnosis not present

## 2016-10-11 DIAGNOSIS — Z7901 Long term (current) use of anticoagulants: Secondary | ICD-10-CM | POA: Diagnosis not present

## 2016-10-11 DIAGNOSIS — C189 Malignant neoplasm of colon, unspecified: Secondary | ICD-10-CM | POA: Diagnosis not present

## 2016-10-11 DIAGNOSIS — M799 Soft tissue disorder, unspecified: Secondary | ICD-10-CM | POA: Diagnosis not present

## 2016-10-11 DIAGNOSIS — E049 Nontoxic goiter, unspecified: Secondary | ICD-10-CM | POA: Diagnosis not present

## 2016-10-11 DIAGNOSIS — K8020 Calculus of gallbladder without cholecystitis without obstruction: Secondary | ICD-10-CM | POA: Diagnosis not present

## 2016-10-11 DIAGNOSIS — Z85038 Personal history of other malignant neoplasm of large intestine: Secondary | ICD-10-CM | POA: Diagnosis not present

## 2016-10-15 DIAGNOSIS — Z86711 Personal history of pulmonary embolism: Secondary | ICD-10-CM | POA: Diagnosis not present

## 2016-10-15 DIAGNOSIS — Z7901 Long term (current) use of anticoagulants: Secondary | ICD-10-CM | POA: Diagnosis not present

## 2016-10-31 ENCOUNTER — Encounter: Payer: Self-pay | Admitting: Internal Medicine

## 2016-11-04 DIAGNOSIS — Z7901 Long term (current) use of anticoagulants: Secondary | ICD-10-CM | POA: Diagnosis not present

## 2016-11-04 DIAGNOSIS — G4700 Insomnia, unspecified: Secondary | ICD-10-CM | POA: Diagnosis not present

## 2016-11-04 DIAGNOSIS — Z86711 Personal history of pulmonary embolism: Secondary | ICD-10-CM | POA: Diagnosis not present

## 2016-11-04 DIAGNOSIS — R600 Localized edema: Secondary | ICD-10-CM | POA: Diagnosis not present

## 2016-11-04 DIAGNOSIS — T24232A Burn of second degree of left lower leg, initial encounter: Secondary | ICD-10-CM | POA: Diagnosis not present

## 2016-11-12 DIAGNOSIS — Z86711 Personal history of pulmonary embolism: Secondary | ICD-10-CM | POA: Diagnosis not present

## 2016-11-12 DIAGNOSIS — Z7901 Long term (current) use of anticoagulants: Secondary | ICD-10-CM | POA: Diagnosis not present

## 2016-11-12 DIAGNOSIS — G4700 Insomnia, unspecified: Secondary | ICD-10-CM | POA: Diagnosis not present

## 2016-11-12 DIAGNOSIS — R0609 Other forms of dyspnea: Secondary | ICD-10-CM | POA: Diagnosis not present

## 2016-11-12 DIAGNOSIS — Z6833 Body mass index (BMI) 33.0-33.9, adult: Secondary | ICD-10-CM | POA: Diagnosis not present

## 2016-11-12 DIAGNOSIS — T300 Burn of unspecified body region, unspecified degree: Secondary | ICD-10-CM | POA: Diagnosis not present

## 2016-11-14 DIAGNOSIS — R319 Hematuria, unspecified: Secondary | ICD-10-CM | POA: Diagnosis not present

## 2016-11-18 DIAGNOSIS — H43811 Vitreous degeneration, right eye: Secondary | ICD-10-CM | POA: Diagnosis not present

## 2016-12-04 ENCOUNTER — Encounter (INDEPENDENT_AMBULATORY_CARE_PROVIDER_SITE_OTHER): Payer: Self-pay

## 2016-12-04 ENCOUNTER — Encounter: Payer: Self-pay | Admitting: Internal Medicine

## 2016-12-04 ENCOUNTER — Ambulatory Visit (INDEPENDENT_AMBULATORY_CARE_PROVIDER_SITE_OTHER): Payer: Medicare Other | Admitting: Internal Medicine

## 2016-12-04 VITALS — BP 132/66 | HR 76 | Ht 63.0 in | Wt 192.0 lb

## 2016-12-04 DIAGNOSIS — L309 Dermatitis, unspecified: Secondary | ICD-10-CM | POA: Diagnosis not present

## 2016-12-04 DIAGNOSIS — Z85038 Personal history of other malignant neoplasm of large intestine: Secondary | ICD-10-CM

## 2016-12-04 DIAGNOSIS — R948 Abnormal results of function studies of other organs and systems: Secondary | ICD-10-CM

## 2016-12-04 DIAGNOSIS — K439 Ventral hernia without obstruction or gangrene: Secondary | ICD-10-CM

## 2016-12-04 NOTE — Progress Notes (Signed)
HISTORY OF PRESENT ILLNESS:  [name] is a 83 y.o. male with hypertension, hyperlipidemia, deep vein thrombosis with pulmonary embolus, recurrent. Vein thrombosis on chronic Coumadin therapy, history of colon cancer 2 for which he is undergone prior sigmoid colectomy and right hemicolectomy respectively. Unsuccessful attempt at colostomy takedown. Last seen in this office 10/30/2009. See that dictation for details. Just prior to that visit, that same month, he underwent surveillance colonoscopy. This through the ostomy. Rectal remnant evaluated as well. No neoplasia. Anastomotic ulcers and small aphthous ileal ulcers noted. Nonspecific biopsies. He has not been seen since. Several years patient has been having some difficulty with his stoma. Some ulceration. Ventral hernia. Has seen a Psychologist, sport and exercise in [place]. He did undergo colonoscopy elsewhere, for reported traveling convenience, with Dr. Glennon [name] in June 2016. I have reviewed that report. Short rectal remnant unremarkable. Some left-sided reticular change. However, could not advance the scope very far due to acute angulation. The pediatric and adult scope tried. A few months ago had problems with hematuria. Saw a urologist. Underwent evaluation. Was found to have ureteral narrowing for which stent placement occurred. Because of history of colon cancer CT PET scan ordered. Prior urology op note and imaging reports reviewed. The CT PET revealed a foci of activity in the right colon near the anastomosis. No CT correlate. Consideration of colonoscopy recommended. His wife schedule this appointment. He is accompanied by his wife.  REVIEW OF SYSTEMS:  All non-GI ROS negative except for blood in urine, fatigue, shortness of breath, sleeping problems, lower extremity edema  Past Medical History:  Diagnosis Date  . Abdominal wall hernia at previous stoma site   . Colon cancer ([facility]) 2000  . DVT (deep venous thrombosis) ([facility])   . GERD (gastroesophageal  reflux disease)   . HLD (hyperlipidemia)   . HTN (hypertension)   . Overactive bladder   . Pulmonary embolism ([place])   . Sleep apnea     Past Surgical History:  Procedure Laterality Date  . bladder stent    . bladder stent removale    . COLON RESECTION     with appendectomy  . TONSILLECTOMY      Social History [name]  reports that he has never smoked. He has never used smokeless tobacco. He reports that he drinks alcohol. He reports that he does not use drugs.  family history includes Diabetes in his mother; Heart attack in his father.  Allergies  Allergen Reactions  . Codeine Other (See Comments)    REACTION: hallucinations REACTION: hallucinations  . Demerol [Meperidine]   . Meperidine Hcl     REACTION: hallucinations  . Phenergan [Promethazine Hcl]   . Promethazine Hcl        PHYSICAL EXAMINATION: Vital signs: BP 132/66 (BP Location: Left Arm, Patient Position: Sitting, Cuff Size: Normal)   Pulse 76   Ht 5\' 3"  (1.6 m) Comment: height measured without shoes  Wt 192 lb (87.1 kg)   BMI 34.01 kg/m   Constitutional: generally well-appearing, no acute distress Psychiatric: alert and oriented x3, cooperative Eyes: extraocular movements intact, anicteric, conjunctiva pink Mouth: oral pharynx moist, no lesions Neck: supple no lymphadenopathy Cardiovascular: heart regular rate and rhythm, no murmur Lungs: clear to auscultation bilaterally Abdomen: soft, nontender, nondistended, no obvious ascites, no peritoneal signs, normal bowel sounds, no organomegaly. Stoma in the mid lower abdomen. Ventral hernia. Rectal:Omitted Extremities: no clubbing or cyanosis. Bilateral lower extremity edema bilaterally with stasis changes Skin: no lesions on visible extremities Neuro: No focal  deficits. Cranial nerves intact. No asterixis.    ASSESSMENT:  #1. Abnormal area on PET scan new the anastomosis. Suspect related to known anastomotic ulceration with associated  inflammation. #2. Last complete colonoscopy here 2011 as described #3. Interval problems with stoma and ventral hernia. Has seen surgery elsewhere #4. Last colonoscopy 2016 as described. Incomplete. I suspect in part due to and hernia #5. Multiple significant medical problems including the need for chronic Coumadin therapy due to recurrent deep vein thrombosis and pulmonary embolism #6. Colon cancer 2 status post right hemicolectomy and segmental sigmoid colectomy. Permanent ostomy. Failed takedown   PLAN:  #1. Given the presence of his hernia and failed recent colonoscopy, recommend barium radiology through the ostomy to rule out right colon mass. We will contact him with the results. #2. Follow-up with your general surgeon regarding issues with ostomy and hernia #3. Ongoing general medical care. PCP, Dr. Virgina [name]  45 minutes spent face-to-face with the patient. Greater than 50% a time use for counseling regarding evaluation of the PET scan abnormality and issues with his stoma and hernia. Multiple appropriate questions for the patient and his wife answered to their satisfaction

## 2016-12-04 NOTE — Patient Instructions (Signed)
You are scheduled for a Barium enema x-ray examination on 12/06/2016 at [facility].   Be prepared to spend 2 to 2 1/2 hours at the Radiology Department  It is very important that you follow the instructions below.  Failure to follow these instructions may result in a study that is less than optimal and may lead to the necessity of repeating the examination.   Barium Enema Prep  Please purchase the following items from the laxative section of your drug store 10 oz.  Magnesium Citrate Bottle 3 Bisacodyl Tablets-Enteric coated 1 Bisacodyl Suppository   Day before exam  12:00 Lunch. This meal may include clear broth, white chicken meat sandwich (no butter, lettuce or other additives), or two hard boiled eggs, strained fruit juices, jello or gelatin ( not containing fruits or nuts).  Coffee or tea (without milk or cream), carbonated beverages.  1:00 pm Drink one full glass or more of water   3:00 pm Drink one full glass or more of water  5:00 pm  Supper.  It is very important that supper be limited to the items described here Broth, Clear jello/gelatin with no whole foods added (no red), soft drinks, gatorade, water black coffee or tea (no milk or cream), popsicles.   No red jello or popsicles.   7:00 pm drink one full glass of water  8:00 pm Drink the bottle of Magnesium Citrate (drink cold if preferred)  10:00 pm  Take three (3) Bisacodyl tablets with one full glass or more of water.   Day of exam  NO breakfast, you may have coffee, tea (without milk or cream), or strained fruit juice 7:00 am unwrap the foil wrapper from bisacodyl suppository and discard the foil.  While lying on your side with thigh elevated, insert the suppository into the rectum and gently push in as far as possible.  Retain the suppository for at least 15 minutes, if possible, before evacuating, even if the urge is strong. Bowel evacuation usually occurs within 15 to 60 minutes.  Patients requiring  assistance should have a bed pan, commode or help readily available.   Report to the radiology department at [facility] on 12/06/2016 at 7:45 am.

## 2016-12-06 ENCOUNTER — Ambulatory Visit (HOSPITAL_COMMUNITY)
Admission: RE | Admit: 2016-12-06 | Discharge: 2016-12-06 | Disposition: A | Discharge status: Home / Self Care | Payer: Medicare Other | Source: Ambulatory Visit | Attending: Internal Medicine | Admitting: Internal Medicine

## 2016-12-06 ENCOUNTER — Telehealth: Payer: Self-pay | Admitting: Gastroenterology

## 2016-12-06 DIAGNOSIS — R948 Abnormal results of function studies of other organs and systems: Secondary | ICD-10-CM | POA: Insufficient documentation

## 2016-12-06 DIAGNOSIS — K8080 Other cholelithiasis without obstruction: Secondary | ICD-10-CM | POA: Insufficient documentation

## 2016-12-06 DIAGNOSIS — Z85038 Personal history of other malignant neoplasm of large intestine: Secondary | ICD-10-CM | POA: Diagnosis not present

## 2016-12-06 DIAGNOSIS — Z933 Colostomy status: Secondary | ICD-10-CM | POA: Insufficient documentation

## 2016-12-06 NOTE — Telephone Encounter (Signed)
Dr. Clovis Riley of Radiology called after you had left the office today regarding this patient's BE.  Despite considerable effort, he was unable to complete the exam due to an apparent anatomic abnormality of the colon near the ostomy.  He is questioning a parastomal hernia or area of severe tortuosity.  You can review the images and/or call him when you return to town.

## 2016-12-17 DIAGNOSIS — Z7901 Long term (current) use of anticoagulants: Secondary | ICD-10-CM | POA: Diagnosis not present

## 2016-12-17 DIAGNOSIS — Z86711 Personal history of pulmonary embolism: Secondary | ICD-10-CM | POA: Diagnosis not present

## 2016-12-24 ENCOUNTER — Ambulatory Visit: Payer: Medicare Other | Admitting: Hematology & Oncology

## 2016-12-24 ENCOUNTER — Other Ambulatory Visit: Payer: Medicare Other

## 2016-12-24 DIAGNOSIS — K435 Parastomal hernia without obstruction or  gangrene: Secondary | ICD-10-CM | POA: Diagnosis not present

## 2016-12-24 DIAGNOSIS — R9402 Abnormal brain scan: Secondary | ICD-10-CM | POA: Diagnosis not present

## 2016-12-26 DIAGNOSIS — R319 Hematuria, unspecified: Secondary | ICD-10-CM | POA: Diagnosis not present

## 2016-12-26 DIAGNOSIS — K8020 Calculus of gallbladder without cholecystitis without obstruction: Secondary | ICD-10-CM | POA: Diagnosis not present

## 2016-12-26 DIAGNOSIS — N135 Crossing vessel and stricture of ureter without hydronephrosis: Secondary | ICD-10-CM | POA: Diagnosis not present

## 2016-12-27 ENCOUNTER — Other Ambulatory Visit: Payer: Self-pay | Admitting: *Deleted

## 2016-12-27 DIAGNOSIS — R3129 Other microscopic hematuria: Secondary | ICD-10-CM

## 2016-12-30 ENCOUNTER — Other Ambulatory Visit (HOSPITAL_BASED_OUTPATIENT_CLINIC_OR_DEPARTMENT_OTHER): Payer: Medicare Other

## 2016-12-30 ENCOUNTER — Ambulatory Visit (HOSPITAL_BASED_OUTPATIENT_CLINIC_OR_DEPARTMENT_OTHER): Payer: Medicare Other | Admitting: Hematology & Oncology

## 2016-12-30 VITALS — BP 123/51 | HR 51 | Temp 98.3°F | Resp 19 | Wt 188.0 lb

## 2016-12-30 DIAGNOSIS — Z933 Colostomy status: Secondary | ICD-10-CM | POA: Diagnosis not present

## 2016-12-30 DIAGNOSIS — Z85038 Personal history of other malignant neoplasm of large intestine: Secondary | ICD-10-CM | POA: Diagnosis not present

## 2016-12-30 DIAGNOSIS — K469 Unspecified abdominal hernia without obstruction or gangrene: Secondary | ICD-10-CM | POA: Diagnosis not present

## 2016-12-30 DIAGNOSIS — I82401 Acute embolism and thrombosis of unspecified deep veins of right lower extremity: Secondary | ICD-10-CM | POA: Diagnosis not present

## 2016-12-30 DIAGNOSIS — C189 Malignant neoplasm of colon, unspecified: Secondary | ICD-10-CM | POA: Diagnosis not present

## 2016-12-30 DIAGNOSIS — J359 Chronic disease of tonsils and adenoids, unspecified: Secondary | ICD-10-CM | POA: Diagnosis not present

## 2016-12-30 DIAGNOSIS — D509 Iron deficiency anemia, unspecified: Secondary | ICD-10-CM | POA: Diagnosis not present

## 2016-12-30 DIAGNOSIS — I82519 Chronic embolism and thrombosis of unspecified femoral vein: Secondary | ICD-10-CM

## 2016-12-30 DIAGNOSIS — R3129 Other microscopic hematuria: Secondary | ICD-10-CM

## 2016-12-30 LAB — CBC WITH DIFFERENTIAL (CANCER CENTER ONLY)
BASO#: 0 10*3/uL (ref 0.0–0.2)
BASO%: 0.4 % (ref 0.0–2.0)
EOS%: 2.5 % (ref 0.0–7.0)
Eosinophils Absolute: 0.1 10*3/uL (ref 0.0–0.5)
HCT: 40.3 % (ref 38.7–49.9)
HGB: 13.7 g/dL (ref 13.0–17.1)
LYMPH#: 1.3 10*3/uL (ref 0.9–3.3)
LYMPH%: 25.2 % (ref 14.0–48.0)
MCH: 32.2 pg (ref 28.0–33.4)
MCHC: 34 g/dL (ref 32.0–35.9)
MCV: 95 fL (ref 82–98)
MONO#: 0.6 10*3/uL (ref 0.1–0.9)
MONO%: 11.2 % (ref 0.0–13.0)
NEUT%: 60.7 % (ref 40.0–80.0)
NEUTROS ABS: 3.2 10*3/uL (ref 1.5–6.5)
Platelets: 120 10*3/uL — ABNORMAL LOW (ref 145–400)
RBC: 4.25 10*6/uL (ref 4.20–5.70)
RDW: 13.2 % (ref 11.1–15.7)
WBC: 5.2 10*3/uL (ref 4.0–10.0)

## 2016-12-30 LAB — CMP (CANCER CENTER ONLY)
ALT(SGPT): 22 U/L (ref 10–47)
AST: 26 U/L (ref 11–38)
Albumin: 3.5 g/dL (ref 3.3–5.5)
Alkaline Phosphatase: 66 U/L (ref 26–84)
BILIRUBIN TOTAL: 0.9 mg/dL (ref 0.20–1.60)
BUN, Bld: 16 mg/dL (ref 7–22)
CO2: 27 meq/L (ref 18–33)
CREATININE: 1 mg/dL (ref 0.6–1.2)
Calcium: 9.3 mg/dL (ref 8.0–10.3)
Chloride: 110 mEq/L — ABNORMAL HIGH (ref 98–108)
GLUCOSE: 99 mg/dL (ref 73–118)
Potassium: 4 mEq/L (ref 3.3–4.7)
SODIUM: 142 meq/L (ref 128–145)
Total Protein: 7 g/dL (ref 6.4–8.1)

## 2016-12-30 LAB — CEA (IN HOUSE-CHCC): CEA (CHCC-In House): <1 ng/mL (ref 0.00–5.00)

## 2016-12-30 LAB — PROTIME-INR (CHCC SATELLITE)
INR: 2.4 (ref 2.0–3.5)
Protime: 28.8 Seconds — ABNORMAL HIGH (ref 10.6–13.4)
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
## 2016-12-31 LAB — CEA: CEA1: 1.4 ng/mL (ref 0.0–4.7)

## 2017-01-02 DIAGNOSIS — H43811 Vitreous degeneration, right eye: Secondary | ICD-10-CM | POA: Diagnosis not present

## 2017-01-02 DIAGNOSIS — H524 Presbyopia: Secondary | ICD-10-CM | POA: Diagnosis not present

## 2017-01-02 DIAGNOSIS — H5201 Hypermetropia, right eye: Secondary | ICD-10-CM | POA: Diagnosis not present

## 2017-01-02 DIAGNOSIS — H40013 Open angle with borderline findings, low risk, bilateral: Secondary | ICD-10-CM | POA: Diagnosis not present

## 2017-01-02 DIAGNOSIS — Z961 Presence of intraocular lens: Secondary | ICD-10-CM | POA: Diagnosis not present

## 2017-01-02 DIAGNOSIS — H35033 Hypertensive retinopathy, bilateral: Secondary | ICD-10-CM | POA: Diagnosis not present

## 2017-01-02 DIAGNOSIS — H52221 Regular astigmatism, right eye: Secondary | ICD-10-CM | POA: Diagnosis not present

## 2017-01-02 DIAGNOSIS — I10 Essential (primary) hypertension: Secondary | ICD-10-CM | POA: Diagnosis not present

## 2017-01-13 DIAGNOSIS — R319 Hematuria, unspecified: Secondary | ICD-10-CM | POA: Diagnosis not present

## 2017-01-15 DIAGNOSIS — Z7901 Long term (current) use of anticoagulants: Secondary | ICD-10-CM | POA: Diagnosis not present

## 2017-01-15 DIAGNOSIS — Z86711 Personal history of pulmonary embolism: Secondary | ICD-10-CM | POA: Diagnosis not present

## 2017-01-15 DIAGNOSIS — K439 Ventral hernia without obstruction or gangrene: Secondary | ICD-10-CM | POA: Diagnosis not present

## 2017-01-17 DIAGNOSIS — J359 Chronic disease of tonsils and adenoids, unspecified: Secondary | ICD-10-CM | POA: Diagnosis not present

## 2017-01-17 DIAGNOSIS — N390 Urinary tract infection, site not specified: Secondary | ICD-10-CM | POA: Diagnosis not present

## 2017-01-17 DIAGNOSIS — R300 Dysuria: Secondary | ICD-10-CM | POA: Diagnosis not present

## 2017-02-14 DIAGNOSIS — J392 Other diseases of pharynx: Secondary | ICD-10-CM | POA: Diagnosis not present

## 2017-02-14 DIAGNOSIS — Z9989 Dependence on other enabling machines and devices: Secondary | ICD-10-CM | POA: Diagnosis not present

## 2017-02-14 DIAGNOSIS — K439 Ventral hernia without obstruction or gangrene: Secondary | ICD-10-CM | POA: Diagnosis not present

## 2017-02-14 DIAGNOSIS — Z433 Encounter for attention to colostomy: Secondary | ICD-10-CM | POA: Diagnosis not present

## 2017-02-14 DIAGNOSIS — J359 Chronic disease of tonsils and adenoids, unspecified: Secondary | ICD-10-CM | POA: Diagnosis not present

## 2017-02-14 DIAGNOSIS — G4733 Obstructive sleep apnea (adult) (pediatric): Secondary | ICD-10-CM | POA: Diagnosis not present

## 2017-02-15 DIAGNOSIS — Z7901 Long term (current) use of anticoagulants: Secondary | ICD-10-CM | POA: Diagnosis not present

## 2017-02-15 DIAGNOSIS — Z79899 Other long term (current) drug therapy: Secondary | ICD-10-CM | POA: Diagnosis not present

## 2017-02-15 DIAGNOSIS — K219 Gastro-esophageal reflux disease without esophagitis: Secondary | ICD-10-CM | POA: Diagnosis present

## 2017-02-15 DIAGNOSIS — G4733 Obstructive sleep apnea (adult) (pediatric): Secondary | ICD-10-CM | POA: Diagnosis present

## 2017-02-15 DIAGNOSIS — N189 Chronic kidney disease, unspecified: Secondary | ICD-10-CM | POA: Diagnosis present

## 2017-02-15 DIAGNOSIS — K439 Ventral hernia without obstruction or gangrene: Secondary | ICD-10-CM | POA: Diagnosis present

## 2017-02-15 DIAGNOSIS — Z86711 Personal history of pulmonary embolism: Secondary | ICD-10-CM | POA: Diagnosis not present

## 2017-02-15 DIAGNOSIS — Z6827 Body mass index (BMI) 27.0-27.9, adult: Secondary | ICD-10-CM | POA: Diagnosis not present

## 2017-02-15 DIAGNOSIS — Z9989 Dependence on other enabling machines and devices: Secondary | ICD-10-CM | POA: Diagnosis not present

## 2017-02-15 DIAGNOSIS — K435 Parastomal hernia without obstruction or  gangrene: Secondary | ICD-10-CM | POA: Diagnosis not present

## 2017-02-15 DIAGNOSIS — E785 Hyperlipidemia, unspecified: Secondary | ICD-10-CM | POA: Diagnosis present

## 2017-02-15 DIAGNOSIS — E669 Obesity, unspecified: Secondary | ICD-10-CM | POA: Diagnosis present

## 2017-02-15 DIAGNOSIS — Z7982 Long term (current) use of aspirin: Secondary | ICD-10-CM | POA: Diagnosis not present

## 2017-02-15 DIAGNOSIS — Z86718 Personal history of other venous thrombosis and embolism: Secondary | ICD-10-CM | POA: Diagnosis not present

## 2017-02-15 DIAGNOSIS — Z885 Allergy status to narcotic agent status: Secondary | ICD-10-CM | POA: Diagnosis not present

## 2017-03-12 DIAGNOSIS — N390 Urinary tract infection, site not specified: Secondary | ICD-10-CM | POA: Diagnosis not present

## 2017-03-20 DIAGNOSIS — Z433 Encounter for attention to colostomy: Secondary | ICD-10-CM | POA: Diagnosis not present

## 2017-03-21 DIAGNOSIS — N281 Cyst of kidney, acquired: Secondary | ICD-10-CM | POA: Diagnosis not present

## 2017-03-21 DIAGNOSIS — K439 Ventral hernia without obstruction or gangrene: Secondary | ICD-10-CM | POA: Diagnosis not present

## 2017-03-21 DIAGNOSIS — R1012 Left upper quadrant pain: Secondary | ICD-10-CM | POA: Diagnosis not present

## 2017-03-21 DIAGNOSIS — N200 Calculus of kidney: Secondary | ICD-10-CM | POA: Diagnosis not present

## 2017-03-21 DIAGNOSIS — Z8719 Personal history of other diseases of the digestive system: Secondary | ICD-10-CM | POA: Diagnosis not present

## 2017-03-21 DIAGNOSIS — K435 Parastomal hernia without obstruction or  gangrene: Secondary | ICD-10-CM | POA: Diagnosis not present

## 2017-03-21 DIAGNOSIS — Z9889 Other specified postprocedural states: Secondary | ICD-10-CM | POA: Diagnosis not present

## 2017-03-25 DIAGNOSIS — Z0000 Encounter for general adult medical examination without abnormal findings: Secondary | ICD-10-CM | POA: Diagnosis not present

## 2017-03-25 DIAGNOSIS — Z85038 Personal history of other malignant neoplasm of large intestine: Secondary | ICD-10-CM | POA: Diagnosis not present

## 2017-03-25 DIAGNOSIS — R7302 Impaired glucose tolerance (oral): Secondary | ICD-10-CM | POA: Diagnosis not present

## 2017-03-25 DIAGNOSIS — I10 Essential (primary) hypertension: Secondary | ICD-10-CM | POA: Diagnosis not present

## 2017-03-25 DIAGNOSIS — R591 Generalized enlarged lymph nodes: Secondary | ICD-10-CM | POA: Diagnosis not present

## 2017-03-25 DIAGNOSIS — Z125 Encounter for screening for malignant neoplasm of prostate: Secondary | ICD-10-CM | POA: Diagnosis not present

## 2017-03-31 DIAGNOSIS — Z433 Encounter for attention to colostomy: Secondary | ICD-10-CM | POA: Diagnosis not present

## 2017-04-08 DIAGNOSIS — Z433 Encounter for attention to colostomy: Secondary | ICD-10-CM | POA: Diagnosis not present

## 2017-05-12 DIAGNOSIS — Z433 Encounter for attention to colostomy: Secondary | ICD-10-CM | POA: Diagnosis not present

## 2017-12-29 ENCOUNTER — Inpatient Hospital Stay (HOSPITAL_BASED_OUTPATIENT_CLINIC_OR_DEPARTMENT_OTHER): Payer: Medicare Other | Admitting: Family

## 2017-12-29 ENCOUNTER — Inpatient Hospital Stay: Payer: Medicare Other | Attending: Hematology & Oncology

## 2017-12-29 VITALS — BP 141/95 | HR 54 | Temp 98.1°F | Wt 189.0 lb

## 2017-12-29 DIAGNOSIS — Z933 Colostomy status: Secondary | ICD-10-CM

## 2017-12-29 DIAGNOSIS — Z79899 Other long term (current) drug therapy: Secondary | ICD-10-CM | POA: Diagnosis not present

## 2017-12-29 DIAGNOSIS — Z85038 Personal history of other malignant neoplasm of large intestine: Secondary | ICD-10-CM

## 2017-12-29 DIAGNOSIS — I82502 Chronic embolism and thrombosis of unspecified deep veins of left lower extremity: Secondary | ICD-10-CM | POA: Diagnosis not present

## 2017-12-29 DIAGNOSIS — Z7982 Long term (current) use of aspirin: Secondary | ICD-10-CM | POA: Diagnosis not present

## 2017-12-29 DIAGNOSIS — Z7901 Long term (current) use of anticoagulants: Secondary | ICD-10-CM | POA: Diagnosis not present

## 2017-12-29 DIAGNOSIS — I82519 Chronic embolism and thrombosis of unspecified femoral vein: Secondary | ICD-10-CM

## 2017-12-29 LAB — CBC WITH DIFFERENTIAL (CANCER CENTER ONLY)
BASOS ABS: 0 10*3/uL (ref 0.0–0.1)
BASOS PCT: 0 %
EOS ABS: 0.2 10*3/uL (ref 0.0–0.5)
Eosinophils Relative: 3 %
HEMATOCRIT: 40 % (ref 38.7–49.9)
HEMOGLOBIN: 13.3 g/dL (ref 13.0–17.1)
Lymphocytes Relative: 21 %
Lymphs Abs: 1.4 10*3/uL (ref 0.9–3.3)
MCH: 31.7 pg (ref 28.0–33.4)
MCHC: 33.3 g/dL (ref 32.0–35.9)
MCV: 95.2 fL (ref 82.0–98.0)
MONO ABS: 0.6 10*3/uL (ref 0.1–0.9)
Monocytes Relative: 8 %
NEUTROS ABS: 4.5 10*3/uL (ref 1.5–6.5)
NEUTROS PCT: 68 %
Platelet Count: 171 10*3/uL (ref 145–400)
RBC: 4.2 MIL/uL (ref 4.20–5.70)
RDW: 12.2 % (ref 11.1–15.7)
WBC Count: 6.7 10*3/uL (ref 4.0–10.0)

## 2017-12-29 LAB — CMP (CANCER CENTER ONLY)
ALBUMIN: 3.9 g/dL (ref 3.5–5.0)
ALT: 14 U/L (ref 0–44)
ANION GAP: 7 (ref 5–15)
AST: 16 U/L (ref 15–41)
Alkaline Phosphatase: 76 U/L (ref 38–126)
BUN: 20 mg/dL (ref 8–23)
CO2: 24 mmol/L (ref 22–32)
Calcium: 9.3 mg/dL (ref 8.9–10.3)
Chloride: 109 mmol/L (ref 98–111)
Creatinine: 1.06 mg/dL (ref 0.61–1.24)
GFR, Est AFR Am: >60 mL/min (ref >60)
GFR, Estimated: >60 mL/min (ref >60)
Glucose, Bld: 116 mg/dL — ABNORMAL HIGH (ref 70–99)
POTASSIUM: 4.4 mmol/L (ref 3.5–5.1)
SODIUM: 140 mmol/L (ref 135–145)
TOTAL PROTEIN: 7.4 g/dL (ref 6.5–8.1)
Total Bilirubin: 0.4 mg/dL (ref 0.3–1.2)

## 2017-12-29 LAB — PROTIME-INR
INR: 1.03
Prothrombin Time: 13.4 seconds (ref 11.4–15.2)

## 2017-12-29 LAB — CEA (IN HOUSE-CHCC)

## 2017-12-29 NOTE — Progress Notes (Signed)
Hematology and Oncology Follow Up Visit  [name] [id number] 1933-10-30 84 y.o. 12/29/2017   Principle Diagnosis:  Stage II (T4 N0 M0) adenocarcinoma of the sigmoid colon Recurrent deep venous thrombosis of the left leg  Current Therapy:   Observation Eliquis is 2.5 mg PO BID - managed by PCP   Interim History:  [name] is here today for follow-up. He is doing quite well and has no complaint at this time.  His ostomy is functioning appropriately. No episodes of bleeding. He does bruise easily on Eliquis.  He has occasional abdominal discomfort and will take a Vicodin as needed which helps.  No fever, chills, n/v, cough, rash, dizziness, SOB, chest pain, palpitations or changes in bladder habits.  No lymphadenopathy noted on exam.  No swelling, tenderness, numbness or tingling in her extremities. No c/o pain.  He has a good appetite and is staying well hydrated. His weight is stable.   ECOG Performance Status: 0 - Asymptomatic  Medications:  Allergies as of 12/29/2017      Reactions   Codeine Other (See Comments)   REACTION: hallucinations REACTION: hallucinations   Demerol [meperidine]    Meperidine Hcl    REACTION: hallucinations   Phenergan [promethazine Hcl]    Promethazine Hcl       Medication List        Accurate as of 12/29/17  9:39 AM. Always use your most recent med list.          apixaban 2.5 MG Tabs tablet Commonly known as:  ELIQUIS Take by mouth.   aspirin 81 MG tablet Take 81 mg by mouth daily.   COUMADIN 5 MG tablet Generic drug:  warfarin Take 5 mg by mouth daily.   docusate sodium 100 MG capsule Commonly known as:  COLACE Take by mouth.   Fish Oil 1200 MG Caps Take by mouth every morning.   furosemide 20 MG tablet Commonly known as:  LASIX   GLUCOSAMINE 1500 COMPLEX Caps Take by mouth every morning.   HYDROcodone-acetaminophen 5-500 MG tablet Commonly known as:  VICODIN Take 1 tablet by mouth as needed. Taking 5-350   lisinopril 10 MG tablet Commonly known as:  PRINIVIL,ZESTRIL Take 10 mg by mouth daily.   Melatonin 5 MG Tabs Take 5 mg by mouth at bedtime.   MULTI-VITAMIN DAILY PO Take by mouth every morning.   pantoprazole 40 MG tablet Commonly known as:  PROTONIX Take 40 mg by mouth daily.   PROBIOTIC PO Take by mouth every morning. [place]   Saw Palmetto 160 MG Caps Take by mouth.   simvastatin 10 MG tablet Commonly known as:  ZOCOR Take 10 mg by mouth at bedtime.   SSD 1 % cream Generic drug:  silver sulfADIAZINE   tamsulosin 0.4 MG Caps capsule Commonly known as:  FLOMAX Take by mouth.   TYLENOL PM EXTRA STRENGTH PO Take by mouth at bedtime as needed and may repeat dose one time if needed.   VITAMIN B 12 PO Take 1,000 Units by mouth once.   VITAMIN D-3 PO Take 2,000 Units by mouth every morning.   zolpidem 10 MG tablet Commonly known as:  AMBIEN       Allergies:  Allergies  Allergen Reactions  . Codeine Other (See Comments)    REACTION: hallucinations REACTION: hallucinations  . Demerol [Meperidine]   . Meperidine Hcl     REACTION: hallucinations  . Phenergan [Promethazine Hcl]   . Promethazine Hcl     Past Medical History,  Surgical history, Social history, and Family History were reviewed and updated.  Review of Systems: All other 10 point review of systems is negative.   Physical Exam:  weight is 189 lb (85.7 kg). His oral temperature is 98.1 F (36.7 C). His blood pressure is 141/95 (abnormal) and his pulse is 54 (abnormal).   Wt Readings from Last 3 Encounters:  12/29/17 189 lb (85.7 kg)  12/30/16 188 lb (85.3 kg)  12/04/16 192 lb (87.1 kg)    Ocular: Sclerae unicteric, pupils equal, round and reactive to light Ear-nose-throat: Oropharynx clear, dentition fair Lymphatic: No cervical, supraclavicular or axillary adenopathy Lungs no rales or rhonchi, good excursion bilaterally Heart regular rate and rhythm, no murmur appreciated Abd soft,  nontender, positive bowel sounds, no liver or spleen tip palpated on exam, no fluid wave  MSK no focal spinal tenderness, no joint edema Neuro: non-focal, well-oriented, appropriate affect Breasts: Deferred   Lab Results  Component Value Date   WBC 6.7 12/29/2017   HGB 13.3 12/29/2017   HCT 40.0 12/29/2017   MCV 95.2 12/29/2017   PLT 171 12/29/2017   Lab Results  Component Value Date   FERRITIN 39 12/20/2014   IRON 64 12/20/2014   TIBC 305 12/20/2014   UIBC 241 12/20/2014   IRONPCTSAT 21 12/20/2014   Lab Results  Component Value Date   RBC 4.20 12/29/2017   No results found for: KPAFRELGTCHN, LAMBDASER, KAPLAMBRATIO No results found for: IGGSERUM, IGA, IGMSERUM No results found for: Odetta Pink, SPEI   Chemistry      Component Value Date/Time   NA 142 12/30/2016 0820   NA 141 12/20/2015 0859   K 4.0 12/30/2016 0820   K 4.1 12/20/2015 0859   CL 110 (H) 12/30/2016 0820   CO2 27 12/30/2016 0820   CO2 22 12/20/2015 0859   BUN 16 12/30/2016 0820   BUN 15.2 12/20/2015 0859   CREATININE 1.0 12/30/2016 0820   CREATININE 1.0 12/20/2015 0859      Component Value Date/Time   CALCIUM 9.3 12/30/2016 0820   CALCIUM 9.3 12/20/2015 0859   ALKPHOS 66 12/30/2016 0820   ALKPHOS 70 12/20/2015 0859   AST 26 12/30/2016 0820   AST 18 12/20/2015 0859   ALT 22 12/30/2016 0820   ALT 13 12/20/2015 0859   BILITOT 0.90 12/30/2016 0820   BILITOT 0.73 12/20/2015 0859      Impression and Plan: [name] is a very pleasant 84 yo gentleman with past history of stage II colon cancer diagnosed and treated in 2000. He continues to do well and so far, there has been no evidence of recurrence.  We will continues to follow along with him and see him annually.  He will contact our office with any questions or concerns. We can certainly see him sooner if need be.   Laverna Peace, NP 7/15/20199:39 AM

## 2018-12-30 ENCOUNTER — Ambulatory Visit: Payer: Medicare Other | Admitting: Family

## 2018-12-30 ENCOUNTER — Other Ambulatory Visit: Payer: Medicare Other

## 2019-06-19 IMAGING — RF DG BE THRU COLOSTOMY
5 series · 5 of 5 positions shown · non-contrast
Comparison: CT 02/10/2006.

CLINICAL DATA: History of colon cancer.

EXAM:
SINGLE COLUMN BARIUM ENEMA
TECHNIQUE: Initial scout AP supine abdominal image obtained to insure adequate
colon cleansing. Barium was introduced into the colon in a
retrograde fashion and refluxed from the rectum to the cecum. Spot
images of the colon followed by overhead radiographs were obtained.
FLUOROSCOPY TIME:  Fluoroscopy Time:  3 minutes 30 second
Radiation Exposure Index (if provided by the fluoroscopic device):
Number of Acquired Spot Images: None

[Series 1: t abdomen supine · 0.15mm/px · 1 of 1 slices shown]
[im 1/1]
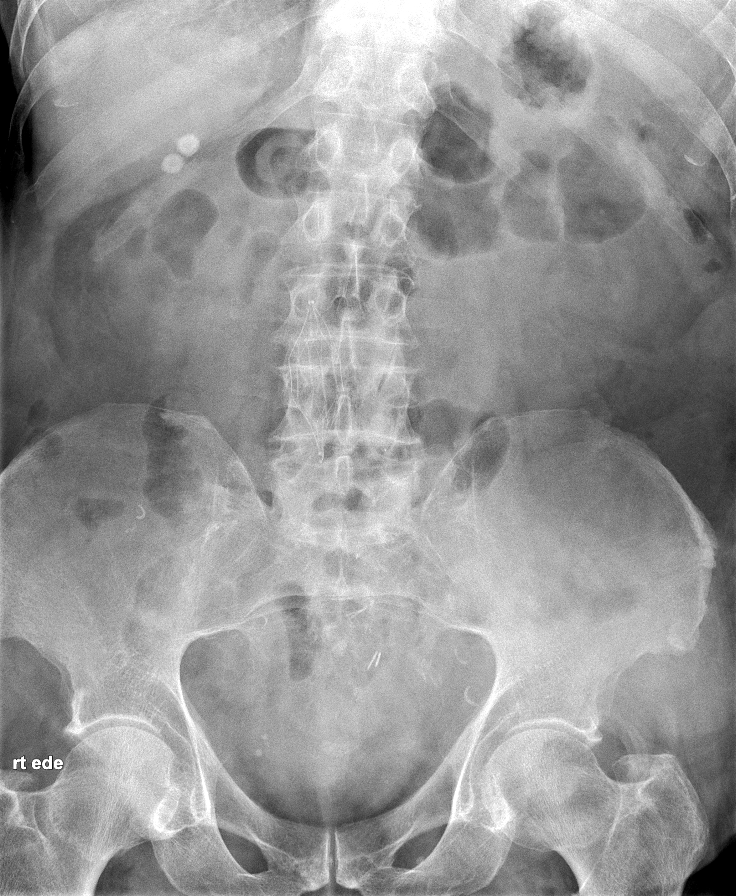

[Series 2: cp_standard · 0.27mm/px · 1 of 1 slices shown (1 of 4)]
[im 1/1]
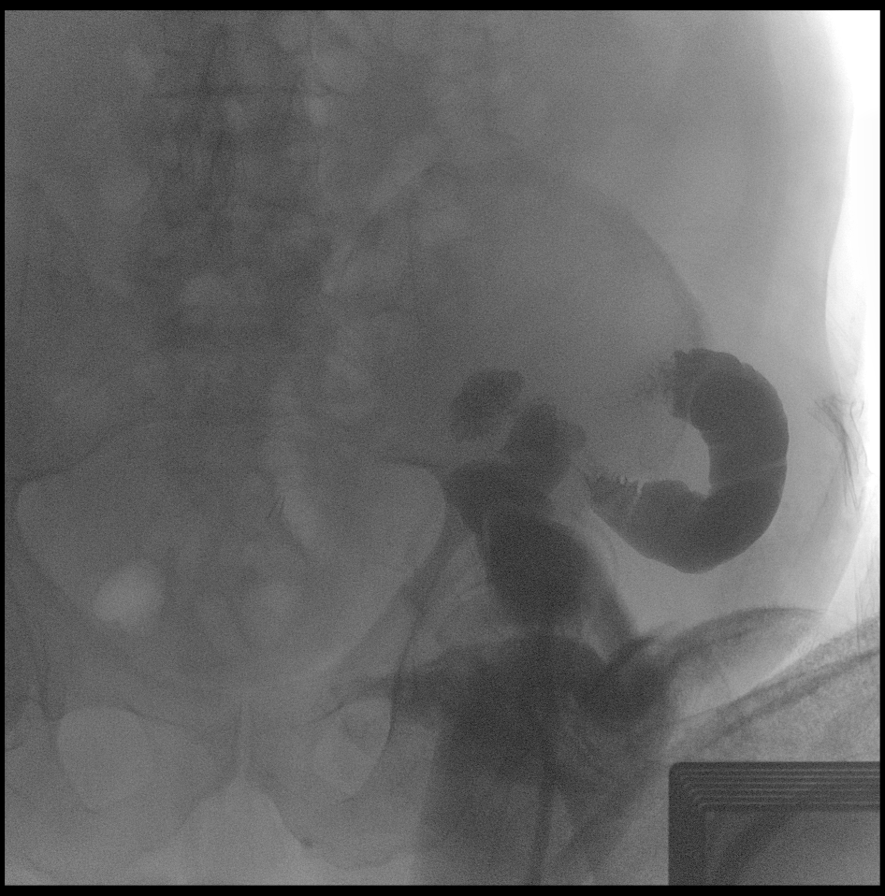

[Series 3: cp_standard · 0.27mm/px · 1 of 1 slices shown (2 of 4)]
[im 1/1]
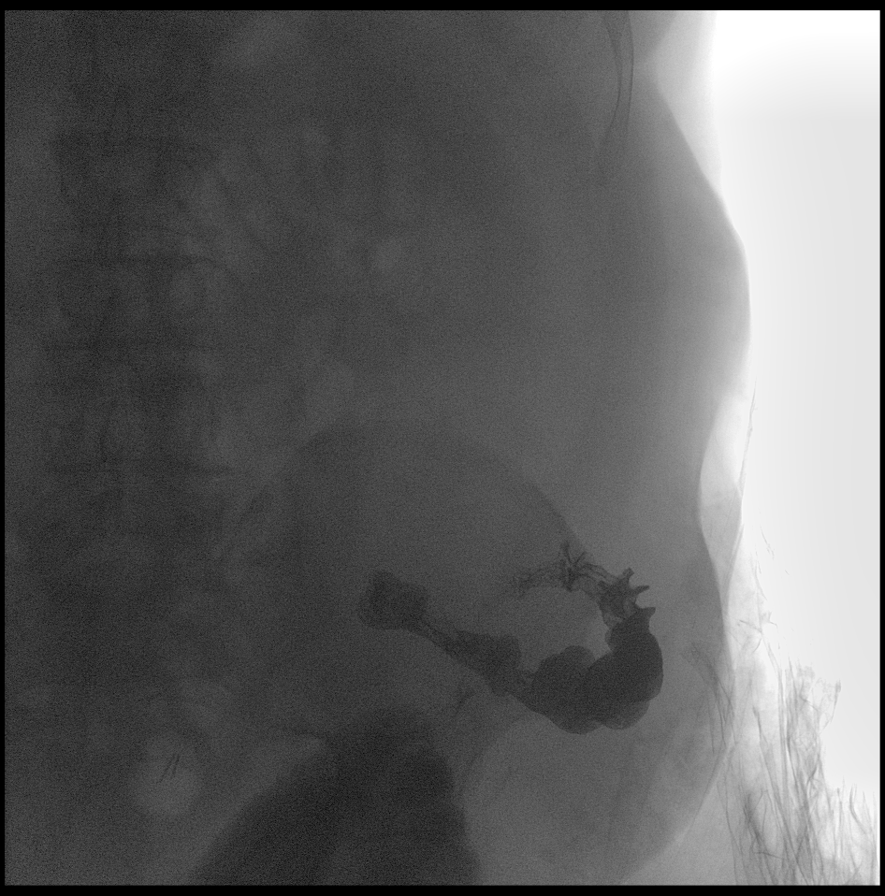

[Series 4: cp_standard · 0.27mm/px · 1 of 1 slices shown (3 of 4)]
[im 1/1]
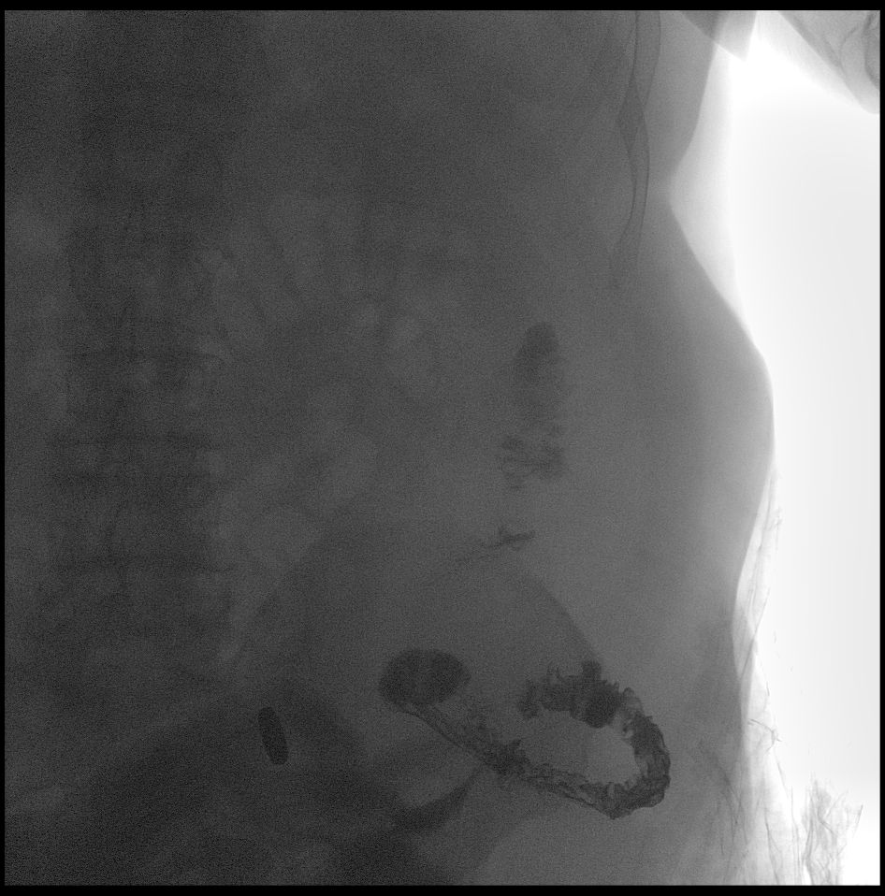

[Series 5: cp_standard · 0.27mm/px · 1 of 1 slices shown (4 of 4)]
[im 1/1]
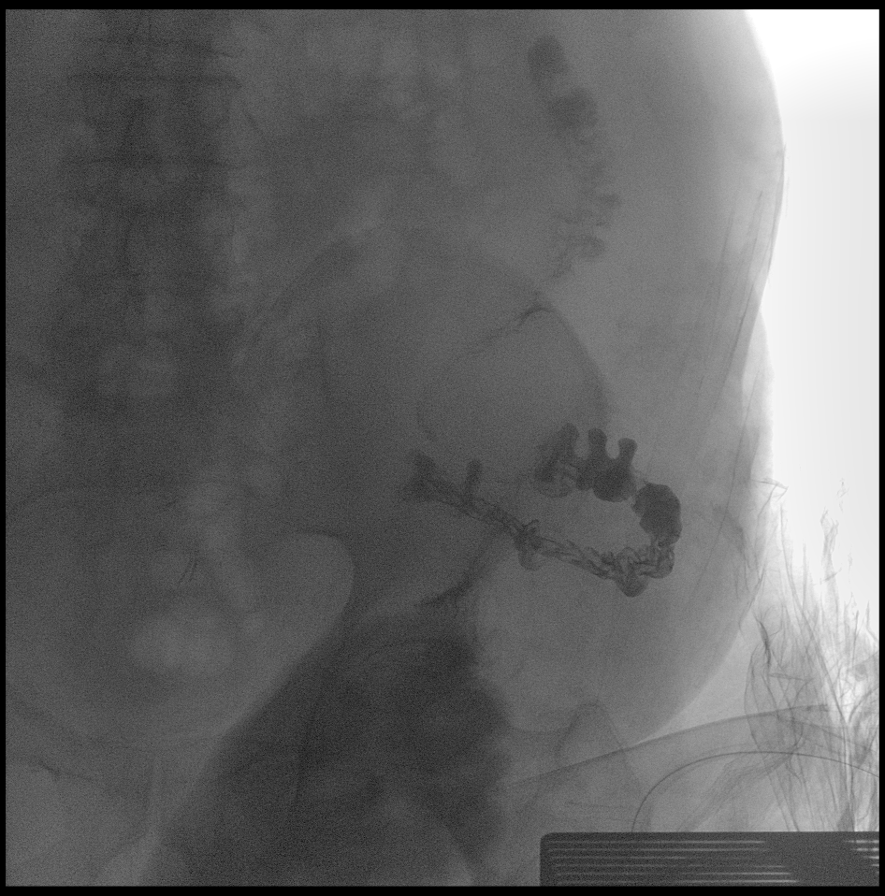

[5 of 5 positions shown; findings below may reference images not displayed]

FINDINGS: On the scout radiograph the bowel gas pattern appears nonobstructed.
No dilated loops of bowel identified. Two gallstones are noted
within the right upper quadrant of the abdomen. There is an IVC
filter in place.

The enema tip was placed into the left lower quadrant colostomy.
Contrast flowed in a retrograde fashion, under gravity, into the
ostomy and opacified a laterally oriented loop of bowel and an then
immediately refluxed around the enema catheter spilling outside of
the ostomy. Multiple attempts to pass contrast beyond the left lower
quadrant loop of were unsuccessful. Next, several attempts using
deferring catheters to access beyond this loop of bowel were
unsuccessful.
IMPRESSION: 1. Just beyond the ostomy there is a laterally oriented loop of
bowel which may I suspect is contained within a hernia. This may be
a parastomal hernia or hernia adjacent to the stomal site. Despite
multiple attempts only a small volume of contrast made it [place] loop
of bowel. A CT following oral contrast administration may be helpful
to further delineate anatomy of the distal large bowel with relation
to suspected hernia.

## 2021-07-18 DEATH — deceased
